# Patient Record
Sex: Male | Born: 1945 | Race: White | Hispanic: No | Marital: Married | Smoking: Former smoker
Health system: Southern US, Community
[De-identification: ages and names within clinical notes are randomized; demographics above are authoritative.]

## PROBLEM LIST (undated history)

## (undated) DIAGNOSIS — I252 Old myocardial infarction: Secondary | ICD-10-CM

## (undated) DIAGNOSIS — E669 Obesity, unspecified: Secondary | ICD-10-CM

## (undated) DIAGNOSIS — I2510 Atherosclerotic heart disease of native coronary artery without angina pectoris: Secondary | ICD-10-CM

## (undated) DIAGNOSIS — I10 Essential (primary) hypertension: Secondary | ICD-10-CM

## (undated) DIAGNOSIS — E119 Type 2 diabetes mellitus without complications: Secondary | ICD-10-CM

## (undated) DIAGNOSIS — G4733 Obstructive sleep apnea (adult) (pediatric): Secondary | ICD-10-CM

## (undated) DIAGNOSIS — E7800 Pure hypercholesterolemia, unspecified: Secondary | ICD-10-CM

## (undated) HISTORY — DX: Obesity, unspecified: E66.9

## (undated) HISTORY — DX: Obstructive sleep apnea (adult) (pediatric): G47.33

## (undated) HISTORY — DX: Old myocardial infarction: I25.2

## (undated) HISTORY — DX: Essential (primary) hypertension: I10

## (undated) HISTORY — DX: Type 2 diabetes mellitus without complications: E11.9

## (undated) HISTORY — PX: KNEE SURGERY: SHX244

## (undated) HISTORY — DX: Atherosclerotic heart disease of native coronary artery without angina pectoris: I25.10

## (undated) HISTORY — DX: Pure hypercholesterolemia, unspecified: E78.00

---

## 1998-01-11 ENCOUNTER — Encounter (HOSPITAL_COMMUNITY): Admission: RE | Admit: 1998-01-11 | Discharge: 1998-04-11 | Payer: Self-pay | Admitting: Cardiology

## 1998-04-12 ENCOUNTER — Encounter (HOSPITAL_COMMUNITY): Admission: RE | Admit: 1998-04-12 | Discharge: 1998-07-11 | Payer: Self-pay | Admitting: Cardiology

## 1998-07-12 ENCOUNTER — Encounter (HOSPITAL_COMMUNITY): Admission: RE | Admit: 1998-07-12 | Discharge: 1998-10-10 | Payer: Self-pay | Admitting: Cardiology

## 1998-10-11 ENCOUNTER — Encounter (HOSPITAL_COMMUNITY): Admission: RE | Admit: 1998-10-11 | Discharge: 1999-01-09 | Payer: Self-pay | Admitting: Cardiology

## 1998-11-14 ENCOUNTER — Other Ambulatory Visit: Admission: RE | Admit: 1998-11-14 | Discharge: 1998-11-14 | Payer: Self-pay | Admitting: Urology

## 1999-01-10 ENCOUNTER — Encounter (HOSPITAL_COMMUNITY): Admission: RE | Admit: 1999-01-10 | Discharge: 1999-04-10 | Payer: Self-pay | Admitting: Cardiology

## 1999-04-11 ENCOUNTER — Encounter (HOSPITAL_COMMUNITY): Admission: RE | Admit: 1999-04-11 | Discharge: 1999-07-10 | Payer: Self-pay | Admitting: Cardiology

## 2000-07-06 ENCOUNTER — Encounter (INDEPENDENT_AMBULATORY_CARE_PROVIDER_SITE_OTHER): Payer: Self-pay | Admitting: Specialist

## 2000-07-06 ENCOUNTER — Inpatient Hospital Stay (HOSPITAL_COMMUNITY): Admission: RE | Admit: 2000-07-06 | Discharge: 2000-07-08 | Payer: Self-pay | Admitting: Urology

## 2002-04-12 ENCOUNTER — Encounter: Admission: RE | Admit: 2002-04-12 | Discharge: 2002-07-11 | Payer: Self-pay | Admitting: Internal Medicine

## 2002-09-13 ENCOUNTER — Encounter: Admission: RE | Admit: 2002-09-13 | Discharge: 2002-12-12 | Payer: Self-pay | Admitting: Internal Medicine

## 2005-06-26 ENCOUNTER — Ambulatory Visit (HOSPITAL_COMMUNITY): Admission: RE | Admit: 2005-06-26 | Discharge: 2005-06-26 | Payer: Self-pay | Admitting: Orthopedic Surgery

## 2007-12-23 ENCOUNTER — Ambulatory Visit: Payer: Self-pay | Admitting: Vascular Surgery

## 2009-01-10 DIAGNOSIS — I2510 Atherosclerotic heart disease of native coronary artery without angina pectoris: Secondary | ICD-10-CM | POA: Insufficient documentation

## 2009-01-10 HISTORY — DX: Atherosclerotic heart disease of native coronary artery without angina pectoris: I25.10

## 2009-01-17 ENCOUNTER — Inpatient Hospital Stay (HOSPITAL_BASED_OUTPATIENT_CLINIC_OR_DEPARTMENT_OTHER): Admission: RE | Admit: 2009-01-17 | Discharge: 2009-01-17 | Payer: Self-pay | Admitting: Cardiology

## 2009-01-17 HISTORY — PX: CARDIAC CATHETERIZATION: SHX172

## 2009-01-18 ENCOUNTER — Ambulatory Visit (HOSPITAL_COMMUNITY): Admission: RE | Admit: 2009-01-18 | Discharge: 2009-01-19 | Payer: Self-pay | Admitting: Cardiology

## 2009-01-24 ENCOUNTER — Encounter (HOSPITAL_COMMUNITY): Admission: RE | Admit: 2009-01-24 | Discharge: 2009-04-24 | Payer: Self-pay | Admitting: Cardiology

## 2009-04-25 ENCOUNTER — Encounter (HOSPITAL_COMMUNITY): Admission: RE | Admit: 2009-04-25 | Discharge: 2009-07-11 | Payer: Self-pay | Admitting: Cardiology

## 2010-06-03 ENCOUNTER — Ambulatory Visit: Payer: Self-pay | Admitting: Cardiology

## 2010-12-12 ENCOUNTER — Ambulatory Visit: Payer: Self-pay | Admitting: Cardiology

## 2011-01-15 ENCOUNTER — Ambulatory Visit: Payer: Self-pay | Admitting: Cardiology

## 2011-01-19 LAB — GLUCOSE, CAPILLARY
Glucose-Capillary: 98 mg/dL (ref 70–99)
Glucose-Capillary: 137 mg/dL — ABNORMAL HIGH (ref 70–99)

## 2011-01-20 LAB — GLUCOSE, CAPILLARY: Glucose-Capillary: 108 mg/dL — ABNORMAL HIGH (ref 70–99)

## 2011-01-21 LAB — CBC
HCT: 43.1 % (ref 39.0–52.0)
MCV: 88.7 fL (ref 78.0–100.0)
Platelets: 124 10*3/uL — ABNORMAL LOW (ref 150–400)
RBC: 4.86 MIL/uL (ref 4.22–5.81)

## 2011-01-21 LAB — BASIC METABOLIC PANEL
BUN: 15 mg/dL (ref 6–23)
Chloride: 103 mEq/L (ref 96–112)
Creatinine, Ser: 1.12 mg/dL (ref 0.4–1.5)
GFR calc Af Amer: >60 mL/min (ref >60)
GFR calc non Af Amer: >60 mL/min (ref >60)
Potassium: 3.8 mEq/L (ref 3.5–5.1)
Sodium: 137 mEq/L (ref 135–145)

## 2011-01-21 LAB — GLUCOSE, CAPILLARY: Glucose-Capillary: 99 mg/dL (ref 70–99)

## 2011-02-04 ENCOUNTER — Encounter: Payer: Self-pay | Admitting: *Deleted

## 2011-02-04 DIAGNOSIS — E119 Type 2 diabetes mellitus without complications: Secondary | ICD-10-CM | POA: Insufficient documentation

## 2011-02-04 DIAGNOSIS — I252 Old myocardial infarction: Secondary | ICD-10-CM | POA: Insufficient documentation

## 2011-02-04 DIAGNOSIS — I2510 Atherosclerotic heart disease of native coronary artery without angina pectoris: Secondary | ICD-10-CM

## 2011-02-04 DIAGNOSIS — E7800 Pure hypercholesterolemia, unspecified: Secondary | ICD-10-CM | POA: Insufficient documentation

## 2011-02-04 DIAGNOSIS — E669 Obesity, unspecified: Secondary | ICD-10-CM | POA: Insufficient documentation

## 2011-02-04 DIAGNOSIS — I10 Essential (primary) hypertension: Secondary | ICD-10-CM | POA: Insufficient documentation

## 2011-02-05 ENCOUNTER — Ambulatory Visit: Payer: Medicare Other | Admitting: Cardiology

## 2011-02-10 ENCOUNTER — Encounter: Payer: Self-pay | Admitting: Cardiology

## 2011-02-10 ENCOUNTER — Ambulatory Visit (INDEPENDENT_AMBULATORY_CARE_PROVIDER_SITE_OTHER): Payer: Medicare Other | Admitting: Cardiology

## 2011-02-10 VITALS — BP 130/88 | HR 60 | Ht 69.0 in | Wt 231.2 lb

## 2011-02-10 DIAGNOSIS — E7800 Pure hypercholesterolemia, unspecified: Secondary | ICD-10-CM

## 2011-02-10 DIAGNOSIS — E669 Obesity, unspecified: Secondary | ICD-10-CM

## 2011-02-10 DIAGNOSIS — I2510 Atherosclerotic heart disease of native coronary artery without angina pectoris: Secondary | ICD-10-CM

## 2011-02-10 DIAGNOSIS — I10 Essential (primary) hypertension: Secondary | ICD-10-CM

## 2011-02-10 NOTE — Assessment & Plan Note (Signed)
Currently on simvastatin 10 mg daily. His blood work is being followed by Dr. Jacky Kindle.

## 2011-02-10 NOTE — Progress Notes (Signed)
Jon Huang Date of Birth: [birth year]   History of Present Illness: Jon Huang is seen for followup today. He states he has been doing very well. He has been going to the gym regularly and walking at least 3 miles per day. He has lost 18 pounds. He continues to work part-time at [employer]. He denies any chest pain, shortness of breath, or palpitations.  Current Outpatient Prescriptions on File Prior to Visit  Medication Sig Dispense Refill  . amLODipine (NORVASC) 10 MG tablet Take 1 tablet by mouth Daily.      Marland Kitchen aspirin 325 MG tablet Take 325 mg by mouth daily.        . benazepril (LOTENSIN) 40 MG tablet Take 1 tablet by mouth Daily.      . clopidogrel (PLAVIX) 75 MG tablet Take 75 mg by mouth daily.        . hydrochlorothiazide 25 MG tablet Take 25 mg by mouth daily.        Marland Kitchen labetalol (NORMODYNE) 200 MG tablet Take 200 mg by mouth 2 (two) times daily.        Marland Kitchen NITROSTAT 0.4 MG SL tablet Place 1 tablet under the tongue as directed.      . simvastatin (ZOCOR) 10 MG tablet Take 10 mg by mouth at bedtime.        Jon Huang 1 % cream as directed.      . triamcinolone (KENALOG) 0.1 % cream as directed.      . valsartan (DIOVAN) 160 MG tablet Take 80 mg by mouth. 1/2 tablet daily       . DISCONTD: amLODipine-benazepril (LOTREL) 10-40 MG per capsule Take 1 capsule by mouth daily.          No Known Allergies  Past Medical History  Diagnosis Date  . Hypertension   . History of heart attack     posterior  . Hypercholesterolemia   . Diabetes mellitus, type 2   . Obesity   . Coronary artery disease April 2010    post stenting of the LAD  / chronic total occlusion of the left circumflex coronary and a high-grade stenosis in the second abtuse marginal vessel that is not amenable to angioplasty    Past Surgical History  Procedure Date  . Cardiac catheterization 01/17/2009    with stenting of the LAD  / chronic total occlusion of the left circumflex coronary and a  high-grade stenosis in the second abtuse marginal vessel that is not amenable to angioplasty  . Knee surgery     History  Smoking status  . Former Smoker  . Types: Cigarettes  . Quit date: 02/03/1986  Smokeless tobacco  . Not on file    History  Alcohol Use No    Family History  Problem Relation Age of Onset  . Stroke      strong family history of   . Heart disease      strong family history of  . Hypertension      strong family history of   . Hypertension Mother   . Heart attack Father     Review of Systems:   All other systems were reviewed and are negative.  Physical Exam: BP 130/88  Pulse 60  Ht 5\' 9"  (1.753 m)  Wt 231 lb 4 oz (104.894 kg)  BMI 34.15 kg/m2 He is a pleasant obese white male in no distress. HEENT exam is unremarkable. He has no JVD or bruits. Lungs are clear. Cardiac  exam reveals a regular rate and rhythm without gallop, murmur, or click. Abdomen is obese, soft, nontender. He has no significant edema. He does have a lot of superficial varicosities in his right lower extremity with some hyperpigmentation. Pedal pulses are good. Neurologic exam is nonfocal. LABORATORY DATA: ECG demonstrates sinus bradycardia with a rate of 57 beats per minute. It is otherwise a normal ECG.  Assessment / Plan:

## 2011-02-10 NOTE — Patient Instructions (Signed)
I am very happy with your weight loss. Keep up the good work.  Stay on the same medications.  Follow up with Dr. Jacky Kindle for your lab work.  We will schedule you for a stress nuclear study to follow up on your coronary artery disease.  I will plan on follow up in 6 months.

## 2011-02-10 NOTE — Assessment & Plan Note (Signed)
Blood pressure is well controlled on multiple medications. We will continue same.

## 2011-02-10 NOTE — Assessment & Plan Note (Signed)
He is currently pain-free. He is doing very well with his exercise program. He has been 3 years since his last stress evaluation so we will schedule him for a stress Myoview study.

## 2011-02-10 NOTE — Assessment & Plan Note (Signed)
I am very encouraged with his weight loss. I've recommended he continue with his exercise and diet program.

## 2011-02-17 ENCOUNTER — Telehealth: Payer: Self-pay | Admitting: Cardiology

## 2011-02-17 NOTE — Telephone Encounter (Signed)
PATSY  FAX [phone] NEEDS NUKE AND ECHO °

## 2011-02-18 ENCOUNTER — Telehealth: Payer: Self-pay | Admitting: *Deleted

## 2011-02-18 ENCOUNTER — Ambulatory Visit (HOSPITAL_COMMUNITY): Payer: Medicare Other | Attending: Cardiology | Admitting: Radiology

## 2011-02-18 VITALS — Ht 69.0 in | Wt 231.0 lb

## 2011-02-18 DIAGNOSIS — I2510 Atherosclerotic heart disease of native coronary artery without angina pectoris: Secondary | ICD-10-CM | POA: Insufficient documentation

## 2011-02-18 MED ORDER — TECHNETIUM TC 99M TETROFOSMIN IV KIT
33.0000 | PACK | Freq: Once | INTRAVENOUS | Status: AC | PRN
  Administered 2011-02-18: 33 via INTRAVENOUS

## 2011-02-18 MED ORDER — TECHNETIUM TC 99M TETROFOSMIN IV KIT
10.6000 | PACK | Freq: Once | INTRAVENOUS | Status: AC | PRN
  Administered 2011-02-18: 11 via INTRAVENOUS

## 2011-02-18 MED ORDER — REGADENOSON 0.4 MG/5ML IV SOLN
0.4000 mg | Freq: Once | INTRAVENOUS | Status: AC
  Administered 2011-02-18: 0.4 mg via INTRAVENOUS

## 2011-02-18 NOTE — Telephone Encounter (Signed)
L/M for pt to call back regarding Labetalol 200mg  tab.  He is taking 2 tablets in the AM; Per Dr. Swaziland:  This should be 1 tab BID;  L/M for pt to call back for instructions.

## 2011-02-18 NOTE — Progress Notes (Signed)
[facility] 3 NUCLEAR MED [address] [place] [address] [phone]  Cardiology Nuclear Med Study  Jon Huang is a 65 y.o. male [id number] Mar 08, 1946   Nuclear Med Background Indication for Stress Test:  Evaluation for Ischemia and PTCA/Stent Patency History:  '97 PWMI; '09 LKG:MWNUU infero-lateral defect, no ischemia, EF=46%,  '10 PTCA/Stent-LAD, '10 Stess Echo:abnormal wall motion. Cardiac Risk Factors: Carotid Disease, Family History - CAD, History of Smoking, Hypertension, Lipids, NIDDM and Obesity  Symptoms:  No cardiac symptoms.   Nuclear Pre-Procedure Caffeine/Decaff Intake:  None NPO After: 8:00pm   Lungs:  Clear.  O2 sat 96%. IV 0.9% NS with Angio Cath:  20g  IV Site: R Hand  IV Started by:  Stanton Kidney, EMT-P  Chest Size (in):  46 Cup Size: n/a  Height: 5\' 9"  (1.753 m)  Weight:  231 lb (104.781 kg)  BMI:  Body mass index is 34.11 kg/(m^2). Tech Comments:  TOOK all meds this morning, per patient.  Labetalol 200mg  two a day, per patient.    Nuclear Med Study 1 or 2 day study: 1 day  Stress Test Type:  Treadmill/Lexiscan  Reading MD: Marca Ancona, MD  Order Authorizing Provider:  Peter Swaziland, MD  Resting Radionuclide: Technetium 63m Tetrofosmin  Resting Radionuclide Dose: 10.6 mCi   Stress Radionuclide:  Technetium 74m Tetrofosmin  Stress Radionuclide Dose: 33.0 mCi           Stress Protocol Rest HR: 59 Stress HR: 110  Rest BP: 105/73 Stress BP: 182/92  Exercise Time (min): 12:29 METS: 11.2   Predicted Max HR: 155 bpm % Max HR: 70.97 bpm Rate Pressure Product: 72536   Dose of Adenosine (mg):  n/a Dose of Lexiscan: 0.4 mg  Dose of Atropine (mg): n/a Dose of Dobutamine: n/a mcg/kg/min (at max HR)  Stress Test Technologist: Smiley Houseman, CMA-N  Nuclear Technologist:  Domenic Polite, CNMT     Rest Procedure:  Myocardial perfusion imaging was performed at rest 45 minutes following the intravenous administration of  Technetium 62m Tetrofosmin. Rest ECG: Nonspecific T-wave changes and isolated PVC.  Stress Procedure:  The patient attempted to walk the treadmill utilizing the Bruce protocol for 10:15, but was unable to reach his target heart rate due to the being on his Labetalol.  He then received IV Lexiscan 0.4 mg over 15-seconds with concurrent low level exercise and then Technetium 72m Tetrofosmin was injected at 30-seconds while the patient continued walking one more minute.  There were no diagnostic changes with Lexiscan, other than occasional PVC's.  Quantitative spect images were obtained after a 45-minute delay. Stress ECG: Insignificant upsloping ST depression.   QPS Raw Data Images:  Normal; no motion artifact; normal heart/lung ratio. Stress Images:  Basal to mid inferolateral perfusion defect.  Rest Images:  Basal to mid inferolateral perfusion defect.  Subtraction (SDS):  Primarily fixed basal to mid inferolateral perfusion defect.  Transient Ischemic Dilatation (Normal <1.22):  1.10 Lung/Heart Ratio (Normal <0.45):  0.37  Quantitative Gated Spect Images QGS EDV:  139 ml QGS ESV:  70 ml QGS cine images:  Inferolateral hypokinesis.  QGS EF: 49%  Impression Exercise Capacity:  Excellent exercise capacity, switched to Lexiscan when unable to attain target heart rate.  BP Response:  Hypertensive blood pressure response. Clinical Symptoms:  No chest pain, switched to Lexiscan because unable to attain target heart rate.  He did exercise for 12:29.  ECG Impression:  Insignificant upsloping ST segment depression. Comparison with Prior Nuclear Study:  No images to compare  Overall Impression:  Primarily fixed basal to mid inferolateral perfusion defect with inferolateral wall motion abnormality. Suspect infarction with minimal ischemia.   Jon Huang [employer]

## 2011-02-19 NOTE — Progress Notes (Signed)
Copy routed to Dr. Swaziland.Mirna Mires

## 2011-02-19 NOTE — Progress Notes (Signed)
Nuclear study shows Inferolateral infarct consistent with known blockage. Otherwise no ischemia. Mildly reduced EF. Continue medical RX. Please report. Theron Arista [place]iland

## 2011-02-20 NOTE — Progress Notes (Signed)
Notified of nuclear stress test results. Also advised that the nuclear nurses stated he was taking his Labetalol 200 mg one daily. Per Dr. Swaziland should be taking 1 tablet twice a day. States he has taken the med like that for 10 yrs and doesn't want to change. Will send copy of nuc test to Dr, Jacky Kindle

## 2011-02-24 NOTE — H&P (Signed)
NAMESOFIA, JAQUITH NO.:  [id number]   MEDICAL RECORD NO.:  [id number]           PATIENT TYPE:   LOCATION:                                 FACILITY:   PHYSICIAN:  Peter M. Swaziland, M.D.       DATE OF BIRTH:   DATE OF ADMISSION:  DATE OF DISCHARGE:                              HISTORY & PHYSICAL   HISTORY OF PRESENT ILLNESS:  Mr. Goree is a 65 year old white male  with known history of coronary artery disease.  He has a history of  remote posterior myocardial infarction in 1997.  Cardiac catheterization  at that time demonstrated occlusion of the left circumflex coronary  artery.  He had no other significant obstructive disease.  Left  ventricular function at that time was low normal with ejection fraction  of 50-55% with posterolateral hypokinesia and mid-inferior akinesia.  He  has been treated medically since that time.  He has had infrequent  follow up.  Recently, he was seen for evaluation.  He has gained a  significant amount of weight of over 17 pounds over the past several  years.  He has had some intermittent back pain.  He really denied any  significant chest pain, shortness of breath, palpitations, or dizziness.  To further evaluate his coronary risk, he underwent a stress Cardiolite  study on December 27, 2007.  His resting ECG was unremarkable.  The patient  was seen for evaluation recently.  He underwent a stress echo evaluation  on January 03, 2009.  He was able to exercise for 7 minutes and 25 seconds  on the Bruce protocol, which represented a significant decrease compared  to prior stress test in 2009.  He did have increased ST-segment  depression in the inferior lateral leads.  He had no significant chest  pain, but did develop dyspnea.  His echocardiographic images  demonstrated akinesia of the posterior wall and he also developed  significant lateral wall hypokinesia.  Given these change in his  exercise tolerance and his echo findings, it is  recommended that he  undergo cardiac catheterization at this time.   PAST MEDICAL HISTORY:  1. He has history of obesity.  2. He has history of remote posterior myocardial infarction.  3. History of hypertension.  4. History of hypercholesterolemia.  5. He has had prior knee surgery.  6. He has a history of diabetes mellitus type 2 and is being treated      with dietary measures.   ALLERGIES:  He has no known allergies.   CURRENT MEDICATIONS:  1. Aspirin 325 mg daily.  2. Labetalol 200 mg b.i.d.  3. Lotrel 5/20 mg b.i.d.  4. Zocor 10 mg daily.  5. Hydrochlorothiazide 25 mg per day.  6. Diovan 160 mg per day.   SOCIAL HISTORY:  The patient is currently working 2 jobs.  He is still  in his business as [employer].  He is married and has 2  children.  He reports, he quit smoking approximately 25 years ago.  He  does drink occasional alcohol.   FAMILY HISTORY:  Father  had a CVA at age 63 and subsequent died of  myocardial infarction at age 29.  Multiple family members have had  hypertension.   REVIEW OF SYSTEMS:  The patient has had weight gain.  He denies any  lower extremity edema, orthopnea, or PND.  He has had no claudication  symptoms.  He has had some back pain.  He denies any bleeding disorder.  No recent history of TIA or stroke.  Appetite, and bowel and bladder  habits have been normal.  All other systems were reviewed and are  negative.   PHYSICAL EXAMINATION:  GENERAL:  The patient is obese white male, in no  distress.  VITAL SIGNS:  His weight is 250.4, blood pressure is 138/88, pulse 62  and regular, respirations are normal.  HEENT:  Normocephalic and atraumatic.  Pupils are equal, round, and  reactive.  Sclerae clear.  Oropharynx is clear.  NECK:  Without JVD, adenopathy, thyromegaly, or bruits.  LUNGS:  Clear.  CARDIAC:  Regular rate and rhythm without gallop, murmur, rub, or click.  ABDOMEN:  Morbidly obese, soft, and nontender.  He has no   hepatosplenomegaly, mass, or bruits.  EXTREMITIES:  Without edema.  Pedal pulses are 2+ and symmetric.  NEUROLOGIC:  He is alert and oriented x4.  Cranial nerves II through XII  are intact.  SKIN:  Warm and dry.   LABORATORY DATA:  His ECG at rest was normal.  His glucose was 114, BUN  16, and creatinine 1.1.  Electrolytes were normal.  CBC was normal.  Coags were normal.   IMPRESSION:  1. Coronary artery disease with known left circumflex coronary      occlusion.  Recent stress echo demonstrates decreased-exercise      tolerance, increase ST changes, and wall motion abnormality.  2. Morbid obesity.  3. Hypertension.  4. Hypercholesterolemia.  5. Diabetes mellitus type 2, diet controlled.   PLAN:  We will proceed with diagnostic cardiac catheterization with  further therapy pending these results.           ______________________________  Peter M. Swaziland, M.D.     PMJ/MEDQ  D:  01/16/2009  T:  01/16/2009  Job:  [id number]   cc:   Geoffry Paradise, M.D.

## 2011-02-24 NOTE — Cardiovascular Report (Signed)
NAMESTANELY, SEXSON NO.:  [id number]   MEDICAL RECORD NO.:  [id number]          PATIENT TYPE:  OIB   LOCATION:  2508                         FACILITY:  [facility]   PHYSICIAN:  Peter M. Swaziland, M.D.  DATE OF BIRTH:  [birth year]   DATE OF PROCEDURE:  DATE OF DISCHARGE:                            CARDIAC CATHETERIZATION   INDICATIONS FOR PROCEDURE:  A 65 year old white male with known history  of coronary disease status post posterior myocardial infarction in 1997  with occlusion of the left circumflex coronary artery.  He presented  recently with an abnormal stress echo test.  Diagnostic cardiac  catheterization demonstrated a high-grade proximal LAD stenosis of 95%.  There was 90-95% stenosis in the mid circumflex followed by total  occlusion following the third marginal branch.  These lesions were felt  to be suitable for catheter-based intervention and he was admitted for  that procedure.   PROCEDURES:  Intracoronary stenting of the proximal LAD and balloon  angioplasty of the mid left circumflex coronary artery.   ACCESS:  Via the right femoral artery using the standard Seldinger  technique.   EQUIPMENT USED:  A 6-French arterial sheath, 6-French Voda left 4 guide,  Prowater wire, high-torque floppy wire, [employer] 3 wire.  For the  LAD, we used a 2.5- x 12-mm apex balloon, a 3.5- x 15-mm XIENCE stent,  and a 3.75- x 12-mm Voyager [place] balloon.  For the left circumflex  coronary artery, we used a 1.5- x 1-mm Sprinter Legend balloon.   MEDICATIONS:  The patient received 2 mg of IV Versed, nitroglycerin 200  mcg intracoronary x1, Angiomax bolus of 0.75 mg/kg followed by  continuous infusion of 1.75 mg/kg per hour.   ACT was 337 seconds.  The patient was hemodynamically stable throughout  the procedure.  Contrast was 410 mL of Omnipaque.   The first intervention was the proximal LAD.  This was a 95% stenosis  after the takeoff of a high diagonal branch.  It  was focal.  It was a  large vessel.  We were able to cross this lesion with a Prowater wire  with moderate difficulty due to tendency to select very small side  branches and the diagonal branch.  However, once we were able to cross  the LAD, procedure went very smoothly.  The LAD lesion was predilated  with the 2.5- x 12-mm apex balloon up to 9 atmospheres x2.  We then  deployed a 3.5- x 15-mm XIENCE drug-eluting stent at 9 atmospheres and  then 12 atmospheres with the stent balloon.  The stent was then  postdilated using a 3.75- x 12-mm [place] Voyager balloon up to 14  atmospheres x2.  This yielded an excellent angiographic result with 0%  residual stenosis and TIMI grade 3.   We next addressed the mid left circumflex coronary stenosis.  Again, the  lesion extended from the second to the origin of the third obtuse  marginal vessel.  Following third obtuse marginal vessel, the left  circumflex was occluded and supplied by right-to-left collaterals.  This  lesion was calcified.  There was also  some involvement of the origin of  the second marginal up to about 60-70%.  We initially planned to wire  both the second and third marginal vessel, predilate with balloons, and  then potentially stent the mid circumflex lesion.  We used the same left  Voda 4 guide.  We used a Research scientist (physical sciences) as well as a Museum/gallery conservator and later a [employer] wire.  This lesion proved to be very  difficult.  We were able to pass wires down both the second and third  obtuse marginal vessel, but it was very difficult to access the third  marginal vessel.  We were unable to pass a 2.5-mm apex balloon down the  circumflex.  We tried a 2.0 apex balloon with inability to cross past  the second obtuse marginal vessel.  We were finally able to cross with a  1.5-mm x 12 mm Sprinter Legend balloon.  We dilated 3 times across the  segment up to 12 atmospheres which was the rated balloon versus  pressure.  However,  despite this dilatation with very small balloon, we  were unable to pass a larger balloon including a 2.0 apex balloon and a  2.0- x 12-mm Sprinter Legend balloon.  We attempted using a buddy wire,  but we are still unable to pass the balloon.  We withdrew the wire down  the second marginal vessel to see if this would help and it gave [place] no  further benefit.  We had excellent guide support with Voda guide.  We  exchanged the Prowater wire for a Mir[employer] 3 wire to increase our  wire support, and despite excellent wire support, we were only able to  cross with a 1.5-mm Sprinter Legend.  We again performed 2 inflations at  14 atmospheres using this balloon, but despite this we were unable to  pass any larger balloon.  At this point, the procedure was ceased.  There was no change in the angiographic appearance.  The patient was  pain free and hemodynamically stable.   FINAL INTERPRETATION:  1. Successful intracoronary stenting of the proximal left anterior      descending.  2. Unsuccessful balloon angioplasty of the mid left circumflex      coronary due to inability to pass an adequate balloon across the      lesion.   PLAN:  At this point, we will continue aspirin and Plavix.  Continue  aggressive risk factor modification.  If the patient were to be  significantly symptomatic from the marginal lesions which I think it is  unlikely that consideration would be to try a rotational atherectomy.           ______________________________  Peter M. Swaziland, M.D.     PMJ/MEDQ  D:  01/18/2009  T:  01/19/2009  Job:  [id number]   cc:   Geoffry Paradise, M.D.

## 2011-02-24 NOTE — Cardiovascular Report (Signed)
NAMEMICIAH, COVELLI NO.:  [id number]   MEDICAL RECORD NO.:  [id number]          PATIENT TYPE:  OIB   LOCATION:  1967                         FACILITY:  [facility]   PHYSICIAN:  Peter M. Swaziland, M.D.  DATE OF BIRTH:  25-Feb-1946   DATE OF PROCEDURE:  01/17/2009  DATE OF DISCHARGE:  01/17/2009                            CARDIAC CATHETERIZATION   HISTORY OF PRESENT ILLNESS:  Jon Huang is a 65 year old white male  with known history of coronary artery disease.  He is status post  posterolateral myocardial infarction in 1997 with documented occlusion  of the distal left circumflex coronary artery.  He presents more  recently with some back pain and has gained a significant amount of  weight.  He underwent a stress echo which showed a decline in exercise  tolerance associated with increased ST-segment depression and septal  hypokinesia in addition to inferolateral hypokinesia.  Based on these  findings, cardiac catheterization was recommended.   PROCEDURE:  Left heart catheterization, coronary and left ventricular  angiography.   EQUIPMENT:  A 4-French 4 cm left Judkins catheter, 4-French 3-DRC  catheter, 4-French pigtail catheter, and 4-French arterial sheath.   MEDICATIONS:  Local anesthesia 1% Xylocaine.   CONTRAST:  100 mL of Omnipaque.   HEMODYNAMIC DATA:  Aortic pressure was 127/75 with a mean of 96 mmHg,  left ventricular pressure is 138 with EDP of 20 mmHg.   ANGIOGRAPHIC DATA:  The left coronary artery arises and distributes  normally.  There is mild atherosclerosis in the left main coronary  artery less than 20%.   The left anterior descending artery is a very large vessel extending out  to the apex.  It has a 30% proximal lesion.  Following the takeoff of a  high diagonal branch, there is a 90-95% eccentric proximal LAD stenosis.  There is TIMI grade 2 flow to the distal LAD.  The first diagonal branch  has a high takeoff and is a moderate-to-large  branch with only minor  irregularities proximally less than 20%.   The left circumflex coronary artery is a codominant vessel.  It gives  rise to a very small first marginal branch, a small second marginal  branch then a larger third marginal branch.  The left circumflex has a  90% stenosis following the second marginal branch up to the third  marginal branch.  The circumflex then occluded after the third marginal  branch.  There are right-to-left collaterals to the distal circumflex  and posterolateral branches.  There is a 60% ostial stenosis in the  second obtuse marginal vessel.  The right coronary artery arises and  distributes normally, it basically gives rise to the PDA.  Has minor  irregularities less than 20%.   Left ventricular angiography was performed in the RAO view.  This  demonstrates normal left ventricular size.  There is mild inferior wall  hypokinesia with overall ejection fraction of 55%.  There is no  significant mitral insufficiency.   FINAL INTERPRETATION:  1. Severe two-vessel obstructive atherosclerotic coronary artery      disease.  2. Good left ventricular systolic function.  PLAN:  We will discuss revascularization options with the patient  including potential PCI of the LAD and mid circumflex versus possible  coronary artery bypass surgery.           ______________________________  Peter M. Swaziland, M.D.     PMJ/MEDQ  D:  01/17/2009  T:  01/18/2009  Job:  [id number]   cc:   Geoffry Paradise, M.D.

## 2011-02-24 NOTE — Procedures (Signed)
CAROTID DUPLEX EXAM   INDICATION:  Syncope.  The patient had one episode of syncope  approximately 2 weeks ago.  The patient states he has experienced the  same sensation episodically since he was 64 years old.  The patient  complains of bilateral hand numbness with position.   HISTORY:  Diabetes:  No.  Cardiac:  MI in 2002.  Hypertension:  Yes.  Smoking:  No.  Previous Surgery:  No.  CV History:  Amaurosis Fugax No, Paresthesias No, Hemiparesis No                                       RIGHT             LEFT  Brachial systolic pressure:         138               140  Brachial Doppler waveforms:         WNL               WNL  Vertebral direction of flow:        Not visualized    Antegrade  DUPLEX VELOCITIES (cm/sec)  CCA peak systolic                   75                69  ECA peak systolic                   102               76  ICA peak systolic                   57                67  ICA end diastolic                   22                22  PLAQUE MORPHOLOGY:                  Intimal thickening                  Homogenous  PLAQUE AMOUNT:                      N/A               Minimal  PLAQUE LOCATION:                    N/A               Bifurcation, ICA   IMPRESSION:  1. 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  2. Patent ECAs bilaterally.  3. 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   ___________________________________________  Quita Skye Hart [place], M.D.   PB/MEDQ  D:  12/23/2007  T:  12/23/2007  Job:  [id number]

## 2011-02-27 NOTE — Op Note (Signed)
[name], Jon Huang              ACCOUNT NO.:  [id number]   MEDICAL RECORD NO.:  [id number]          PATIENT TYPE:  AMB   LOCATION:  DAY                          FACILITY:  [facility]   PHYSICIAN:  Marlowe Kays, M.D.  DATE OF BIRTH:  01/30/1946   DATE OF PROCEDURE:  06/26/2005  DATE OF DISCHARGE:                                 OPERATIVE REPORT   PREOPERATIVE DIAGNOSIS:  Posterior horn tear, medial meniscus, left knee.   POSTOPERATIVE DIAGNOSES:  1.  Posterior horn tear of medial meniscus.  2.  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   OPERATION:  Left knee arthroscopy with partial medial meniscectomy and  debridement of medial femoral condyle.   SURGEON:  Marlowe Kays, M.D.   ASSISTANT:  Nurse.   ANESTHESIA:  General ______________   INDICATIONS FOR PROCEDURE:  He injured his left knee playing tennis on  September 6 with a MRI demonstrating the extensive tear. He also had fairly  diffuse grade 2/4 chondromalacia of the medial femoral condyle. The  remainder of his knee looked unremarkable.   PROCEDURE:  Satisfactory general anesthesia, pneumatic tourniquet. Leg was  Esmarch up nonsterilely, thigh stabilizer. Right leg was prepped with  DuraPrep from stabilizer to ankle and draped in a sterile field. Ace wrap  and knee protector to the right leg. Superior medial saline inflow, first  through an anterolateral portal, the medial joint was evaluated. He had a  good bit of synovitis in the anterior compartment of the knee which I  resected with 3.5 shaver. The chondromalacia of the medial femoral condyle,  I debrided down to smooth surface. The posterior horn tear was quite  extensive, and starting with small flap baskets, I resected most of the  meniscus back to stable rim and followed this with 3.5 shaver which I  smoothed, smoothing down the meniscus until smooth and stable on probing. He  did have some element of bleeding from the synovium posteriorly,  presumably  due to irritation from entrapment of the meniscus tear; this was not  iatrogenic. Then looking at median gutter and suprapatellar area, had some  wear of the patella but nothing that required shaving. I reversed portals.  His lateral compartment looked normal. The knee joint was then irrigated  until clear. There was no bleeding present at this time. The 2-inch portals  were closed with 4-0 nylon. I injected 20 cc of 1/2% Marcaine with adrenalin  and 4 mg of morphine through the inflow apparatus  which was then removed and this portal closed with 4-0 nylon as well.  Betadine, Adaptic and dry dressing were applied. Tourniquet was released. He  tolerated the procedure well and was taken to the recovery room in  satisfactory condition without complications.           ______________________________  Marlowe Kays, M.D.     JA/MEDQ  D:  06/26/2005  T:  06/27/2005  Job:  [id number]

## 2011-02-27 NOTE — H&P (Signed)
[facility]  Patient:    Jon Huang, Jon Huang                     MRN: [id number] Adm. Date:  [id number] Attending:  Londell Moh                         History and Physical  SERVICE:  Urology.  ADMITTING DIAGNOSIS:  Benign prostatic hypertrophy with bladder outlet obstruction.  SECONDARY DIAGNOSES 1. Hypertension. 2. Coronary artery disease.  HISTORY:  This 65 year old male has had longstanding problems with bladder outlet obstruction.  The patient has tried both alpha blockers and Proscar without much improvement.  The patient has requested that a permanent solution be utilized to try to treat his bladder outlet symptoms.  He was given the option of TUNA, ______  versus TURP and elected to undergo TURP, to be admitted following that procedure.  PAST MEDICAL HISTORY:  Remarkable for hypertension and coronary artery disease.  Patient had a cardiac catheterization.  He uses nitroglycerin but he uses that very infrequently.  He is also known to have some problems with chronic joint pain.  CURRENT MEDICATIONS 1. Verapamil 240 mg b.i.d. 2. Normodyne 200 mg daily. 3. ______ b.i.d. 4. Aspirin one daily, which was stopped one week ago. 5. Nitroglycerin sublingual.  PREVIOUS SURGERY:  Knee surgery x 2, which was complicated by postoperative nausea and vomiting.  SOCIAL HISTORY:  His social history is unremarkable.  He does not smoke or use alcohol but did smoke in the past.  He is married.  REVIEW OF SYSTEMS:  His review of systems is otherwise noncontributory.  PHYSICAL EXAMINATION  VITAL SIGNS:  His temperature is 98.2, pulse 68, respirations 20, blood pressure 138/90.  GENERAL:  He is a well-developed, well-nourished male in no acute distress.  HEENT:  Normocephalic, atraumatic.  Cranial nerves II-XII appeared grossly intact.  NECK:  Supple with no adenopathy or thyromegaly.  HEART:  Regular rate and rhythm with no murmurs, thrills,  gallops, rubs or heaves.  LUNGS:  Clear.  ABDOMEN:  Soft and nontender with no palpable masses, rebound or guarding.  GU:  Testicles are of normal size, shape and consistency.  Cord structures are normal.  No hydrocele, spermatocele, varicocele, hernia or adenopathy.  EXTREMITIES:  No cyanosis, clubbing or edema.  RECTAL:  Examination shows a 4+ benign-feeling prostate.  IMPRESSION:  Benign prostatic hypertrophy with bladder outlet obstruction.  PLAN:  Admit following TURP.DD:  07/06/00 TD:  07/07/00 Job: [id number]

## 2011-02-27 NOTE — Op Note (Signed)
[facility]  Patient:    Jon Huang, Jon Huang                     MRN: [id number] Proc. Date: 07/06/00 Adm. Date:  [id number] Attending:  Londell Moh CC:         Richard A. Jacky Kindle, M.D.   Operative Report  PREOPERATIVE DIAGNOSES:   Benign prostatic hypertrophy with bladder outlet obstruction.  POSTOPERATIVE DIAGNOSES:  Benign prostatic hypertrophy with bladder outlet obstruction.  OPERATION:  Cystoscopy and transurethral resection of prostate.  SURGEON:  Jamison Neighbor, M.D.  ANESTHESIA:  General.  COMPLICATIONS:  None.  DRAINS:  None.  INDICATIONS:  [age]-year-old male has had problems with bladder outlet for many years.  Because of the patients cardiac history, we have endeavored as much as possible to treat him in a nonoperative fashion.  The patient was initially treated with Flomax with only minimal improvement.  He was subsequently placed on a combination of Flomax and Proscar and at first he thought that this did control his symptoms.  Over the last year or so as symptoms have increased and his _____ score has gotten much higher.  He is now to the point where is requesting a more permanent solution.  The patient was given the option of continuing on medical management as well as the option of TURP versus TUNA versus TARGIS.  The patient asked to have the most definitive procedure done and is now to undergo a TURP.  He understands the risks and benefits of the procedure.  He is simply advised that he might have postoperative hematuria as well as potential risks for impotence and incontinence or retention.  He gave full and informed consent.  DESCRIPTION OF PROCEDURE:  After the successful induction of general anesthesia, the patient was placed in the dorsal lithotomy position and prepped with Betadine and draped in the usual sterile fashion.  The urethra was calibrated to 30 [place] with [employer] sound.  The Olympus continuous  flow resectoscope sheath was then inserted and using a Timberlake obturator, the Stern-McCarthy resectoscope was inserted with a 12 degree lens in place.  The bladder was carefully inspected.  Trabeculation was noted.  There were not tumors stones.  The ureteral orifices were unremarkable and well back from the intended line of resection.  The patient had lateral lobe as well as median lobe hypertrophy.  The bladder neck was very carefully taken down and the median lobe was resected all the way back to the verumontanum. The right lateral lobe was then resected beginning at the 11 oclock position and extending down to the floor of the prostate.  This dissection was taken down to the surgical capsule and out as far as the verumontanum.  An identical procedure was performed on the left side.  Some additional apical tissue and tissue across the floor resected and some tissue anteriorly was also resected. All chips was irrigated from the bladder.  Hemostasis was obtained with electrocautery.  Careful inspection showed that the ureteral orifices did not ________, the bladder neck could not be undermined, the verumontanum was not injured and an adequate resection had been done.  A rectal exam showed that there was little if any prostatic tissue remaining. the resectoscope was removed.  A Foley catheter was inserted and was placed to continuous bladder irrigation, and this irrigated freely.  The patient tolerated the procedure well and was taken to the recovery room in good condition. DD:  07/06/00 TD:  07/07/00 Job: [id number]

## 2011-06-29 ENCOUNTER — Other Ambulatory Visit: Payer: Self-pay | Admitting: *Deleted

## 2011-06-29 MED ORDER — CLOPIDOGREL BISULFATE 75 MG PO TABS: 75.0000 mg | ORAL_TABLET | Freq: Every day | ORAL | Status: DC

## 2011-06-29 NOTE — Telephone Encounter (Signed)
Refilled generic plavix 

## 2011-08-24 ENCOUNTER — Encounter: Payer: Self-pay | Admitting: Cardiology

## 2011-08-24 ENCOUNTER — Ambulatory Visit (INDEPENDENT_AMBULATORY_CARE_PROVIDER_SITE_OTHER): Payer: Medicare Other | Admitting: Cardiology

## 2011-08-24 DIAGNOSIS — I2510 Atherosclerotic heart disease of native coronary artery without angina pectoris: Secondary | ICD-10-CM

## 2011-08-24 DIAGNOSIS — E7800 Pure hypercholesterolemia, unspecified: Secondary | ICD-10-CM

## 2011-08-24 MED ORDER — CLOPIDOGREL BISULFATE 75 MG PO TABS: 75.0000 mg | ORAL_TABLET | Freq: Every day | ORAL | Status: DC

## 2011-08-24 NOTE — Patient Instructions (Signed)
We will call for your last lab work from Dr. Jacky Kindle.  Continue your current medications.  I will see you again in 6 months.

## 2011-08-26 NOTE — Assessment & Plan Note (Signed)
Lipid status is unknown. He is on simvastatin. Will request a copy of his last lab work. If not current he will need to have this repeated.

## 2011-08-26 NOTE — Assessment & Plan Note (Addendum)
His recent stress test was encouraging. I have encouraged him with his efforts at weight loss. He is to continue with his exercise program. He needs to see Dr. Jacky Kindle to update his lab work. I'll follow up again in 6 months.

## 2011-08-26 NOTE — Progress Notes (Signed)
Tinnie Gens Rayner Date of Birth: 1946/03/03   History of Present Illness: Jon Huang is seen for followup today. He states he has been doing very well. He continues to exercise regularly and is involved in a [employer] program. He also plays golf. He is still working 40 hours a week at [employer]. He denies any chest pain or shortness of breath. His typical blood pressure reading is 130/85. He cannot recall when he had his last lab work done but feels that he is due to see Dr. Jacky Kindle soon about this.  Current Outpatient Prescriptions on File Prior to Visit  Medication Sig Dispense Refill  . amLODipine (NORVASC) 10 MG tablet Take 1 tablet by mouth Daily.      Marland Kitchen aspirin 325 MG tablet Take 325 mg by mouth daily.        . benazepril (LOTENSIN) 40 MG tablet Take 1 tablet by mouth Daily.      . clopidogrel (PLAVIX) 75 MG tablet Take 1 tablet (75 mg total) by mouth daily.  30 tablet  11  . hydrochlorothiazide 25 MG tablet Take 25 mg by mouth daily.        Marland Kitchen labetalol (NORMODYNE) 200 MG tablet Take 200 mg by mouth 2 (two) times daily.        Marland Kitchen NITROSTAT 0.4 MG SL tablet Place 1 tablet under the tongue as directed.      . simvastatin (ZOCOR) 10 MG tablet Take 10 mg by mouth at bedtime.        . valsartan (DIOVAN) 160 MG tablet Take 80 mg by mouth. 1/2 tablet daily         No Known Allergies  Past Medical History  Diagnosis Date  . Hypertension   . History of heart attack     posterior  . Hypercholesterolemia      History of hypercholesterolemia  . Diabetes mellitus, type 2   . Obesity   . Coronary artery disease April 2010    post stenting of the LAD  / chronic total occlusion of the left circumflex coronary and a high-grade stenosis in the second abtuse marginal vessel that is not amenable to angioplasty    Past Surgical History  Procedure Date  . Cardiac catheterization 01/17/2009    with stenting of the LAD  / chronic total occlusion of the left circumflex coronary and a high-grade  stenosis in the second abtuse marginal vessel that is not amenable to angioplasty  . Knee surgery     History  Smoking status  . Former Smoker  . Types: Cigarettes  . Quit date: 02/03/1986  Smokeless tobacco  . Not on file    History  Alcohol Use No    Family History  Problem Relation Age of Onset  . Stroke      strong family history of   . Heart disease      strong family history of  . Hypertension      strong family history of   . Hypertension Mother   . Heart attack Father     Review of Systems:  As noted in history of present illness. All other systems were reviewed and are negative.  Physical Exam: BP 130/82  Pulse 62  Ht 5\' 9"  (1.753 m)  Wt 231 lb 6.4 oz (104.962 kg)  BMI 34.17 kg/m2 He is a pleasant obese white male in no distress. HEENT exam is unremarkable. He has no JVD or bruits. Lungs are clear. Cardiac exam reveals a regular rate  and rhythm without gallop, murmur, or click. Abdomen is obese, soft, nontender. He has no significant edema. He does have a lot of superficial varicosities in his right lower extremity with some hyperpigmentation. Pedal pulses are good. Neurologic exam is nonfocal. LABORATORY DATA:   Assessment / Plan:

## 2011-09-08 ENCOUNTER — Other Ambulatory Visit: Payer: Self-pay | Admitting: Cardiology

## 2011-10-24 DIAGNOSIS — S93409A Sprain of unspecified ligament of unspecified ankle, initial encounter: Secondary | ICD-10-CM | POA: Diagnosis not present

## 2011-11-09 DIAGNOSIS — I10 Essential (primary) hypertension: Secondary | ICD-10-CM | POA: Diagnosis not present

## 2011-11-09 DIAGNOSIS — R05 Cough: Secondary | ICD-10-CM | POA: Diagnosis not present

## 2011-11-09 DIAGNOSIS — R059 Cough, unspecified: Secondary | ICD-10-CM | POA: Diagnosis not present

## 2011-11-09 DIAGNOSIS — J069 Acute upper respiratory infection, unspecified: Secondary | ICD-10-CM | POA: Diagnosis not present

## 2011-11-13 DIAGNOSIS — E785 Hyperlipidemia, unspecified: Secondary | ICD-10-CM | POA: Diagnosis not present

## 2011-11-13 DIAGNOSIS — E119 Type 2 diabetes mellitus without complications: Secondary | ICD-10-CM | POA: Diagnosis not present

## 2011-11-13 DIAGNOSIS — I10 Essential (primary) hypertension: Secondary | ICD-10-CM | POA: Diagnosis not present

## 2011-11-13 DIAGNOSIS — Z125 Encounter for screening for malignant neoplasm of prostate: Secondary | ICD-10-CM | POA: Diagnosis not present

## 2011-11-20 DIAGNOSIS — I10 Essential (primary) hypertension: Secondary | ICD-10-CM | POA: Diagnosis not present

## 2011-11-20 DIAGNOSIS — Z23 Encounter for immunization: Secondary | ICD-10-CM | POA: Diagnosis not present

## 2011-11-20 DIAGNOSIS — Z125 Encounter for screening for malignant neoplasm of prostate: Secondary | ICD-10-CM | POA: Diagnosis not present

## 2011-11-20 DIAGNOSIS — I2510 Atherosclerotic heart disease of native coronary artery without angina pectoris: Secondary | ICD-10-CM | POA: Diagnosis not present

## 2011-11-20 DIAGNOSIS — E1159 Type 2 diabetes mellitus with other circulatory complications: Secondary | ICD-10-CM | POA: Diagnosis not present

## 2011-11-20 DIAGNOSIS — Z0000 Encounter for general adult medical examination without abnormal findings: Secondary | ICD-10-CM | POA: Diagnosis not present

## 2012-01-06 DIAGNOSIS — M25579 Pain in unspecified ankle and joints of unspecified foot: Secondary | ICD-10-CM | POA: Diagnosis not present

## 2012-02-01 ENCOUNTER — Other Ambulatory Visit: Payer: Self-pay | Admitting: Cardiology

## 2012-03-11 ENCOUNTER — Encounter: Payer: Self-pay | Admitting: Cardiology

## 2012-03-11 ENCOUNTER — Other Ambulatory Visit: Payer: Self-pay | Admitting: Cardiology

## 2012-03-11 ENCOUNTER — Ambulatory Visit (INDEPENDENT_AMBULATORY_CARE_PROVIDER_SITE_OTHER): Payer: Medicare Other | Admitting: Cardiology

## 2012-03-11 VITALS — BP 136/80 | HR 58 | Ht 68.0 in | Wt 235.0 lb

## 2012-03-11 DIAGNOSIS — E119 Type 2 diabetes mellitus without complications: Secondary | ICD-10-CM | POA: Diagnosis not present

## 2012-03-11 DIAGNOSIS — E669 Obesity, unspecified: Secondary | ICD-10-CM | POA: Diagnosis not present

## 2012-03-11 DIAGNOSIS — I2510 Atherosclerotic heart disease of native coronary artery without angina pectoris: Secondary | ICD-10-CM

## 2012-03-11 DIAGNOSIS — I10 Essential (primary) hypertension: Secondary | ICD-10-CM

## 2012-03-11 NOTE — Assessment & Plan Note (Signed)
Have recommended a Mediterranean diet and focus on restriction of his carbohydrate intake.

## 2012-03-11 NOTE — Patient Instructions (Signed)
Continue your current medication  Try and lose weight.  I will see you again in 6 months.

## 2012-03-11 NOTE — Assessment & Plan Note (Signed)
Jon Huang remains asymptomatic. We will continue with his current medical therapy including aspirin, Plavix, beta blocker, ACE inhibitor, and amlodipine. We'll followup again in 6 months.

## 2012-03-11 NOTE — Progress Notes (Signed)
Jon Huang Date of Birth: 1946/09/03   History of Present Illness: Jon Huang is seen for followup today. He states he has been doing very well. He continues to exercise regularly. He also plays golf. He is still working 40 hours a week at [employer]. He denies any chest pain or shortness of breath. He reports his blood sugars have been under good control and that his recent lipid panel with Dr. Jacky Kindle was normal. He is still struggling with his weight. 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  Current Outpatient Prescriptions on File Prior to Visit  Medication Sig Dispense Refill  . amLODipine (NORVASC) 10 MG tablet Take 1 tablet by mouth Daily.      Marland Kitchen aspirin 325 MG tablet Take 325 mg by mouth daily.        . benazepril (LOTENSIN) 40 MG tablet Take 1 tablet by mouth Daily.      . hydrochlorothiazide 25 MG tablet Take 25 mg by mouth daily.        Marland Kitchen NITROSTAT 0.4 MG SL tablet Place 1 tablet under the tongue as directed.      Marland Kitchen NORMODYNE 200 MG tablet TAKE 1 TABLET TWICE DAILY.  62 each  4  . PLAVIX 75 MG tablet TAKE 1 TABLET EACH DAY.  30 each  5  . simvastatin (ZOCOR) 10 MG tablet Take 10 mg by mouth at bedtime.        . valsartan (DIOVAN) 160 MG tablet Take 160 mg by mouth.         No Known Allergies  Past Medical History  Diagnosis Date  . Hypertension   . History of heart attack     posterior  . Hypercholesterolemia      History of hypercholesterolemia  . Diabetes mellitus, type 2   . Obesity   . Coronary artery disease April 2010    post stenting of the LAD  / chronic total occlusion of the left circumflex coronary and a high-grade stenosis in the second abtuse marginal vessel that is not amenable to angioplasty    Past Surgical History  Procedure Date  . Cardiac catheterization 01/17/2009    with stenting of the LAD  / chronic total occlusion of the left  circumflex coronary and a high-grade stenosis in the second abtuse marginal vessel that is not amenable to angioplasty  . Knee surgery     History  Smoking status  . Former Smoker  . Types: Cigarettes  . Quit date: 02/03/1986  Smokeless tobacco  . Never Used    History  Alcohol Use No    Family History  Problem Relation Age of Onset  . Stroke      strong family history of   . Heart disease      strong family history of  . Hypertension      strong family history of   . Hypertension Mother   . Heart attack Father     Review of Systems:  As noted in history of present illness. All other systems were reviewed and are negative.  Physical Exam: BP 136/80  Pulse 58  Ht 5\' 8"  (1.727 m)  Wt 235 lb (106.595 kg)  BMI 35.73 kg/m2 He is a pleasant obese white male in no distress. HEENT exam is unremarkable. He has no JVD or bruits. Lungs are clear. Cardiac exam reveals a regular rate and rhythm  without gallop, murmur, or click. Abdomen is obese, soft, nontender. He has no significant edema. He does have a lot of superficial varicosities in his right lower extremity with some hyperpigmentation. Pedal pulses are good. Neurologic exam is nonfocal. LABORATORY DATA: ECG today shows sinus bradycardia with a rate of 58 beats per minute. It is otherwise normal.  Assessment / Plan:

## 2012-03-11 NOTE — Assessment & Plan Note (Signed)
Blood pressure is well controlled on his current medical regimen. 

## 2012-03-14 DIAGNOSIS — M6530 Trigger finger, unspecified finger: Secondary | ICD-10-CM | POA: Diagnosis not present

## 2012-06-02 DIAGNOSIS — I2510 Atherosclerotic heart disease of native coronary artery without angina pectoris: Secondary | ICD-10-CM | POA: Diagnosis not present

## 2012-06-02 DIAGNOSIS — I10 Essential (primary) hypertension: Secondary | ICD-10-CM | POA: Diagnosis not present

## 2012-06-02 DIAGNOSIS — E669 Obesity, unspecified: Secondary | ICD-10-CM | POA: Diagnosis not present

## 2012-06-02 DIAGNOSIS — E1159 Type 2 diabetes mellitus with other circulatory complications: Secondary | ICD-10-CM | POA: Diagnosis not present

## 2012-06-28 DIAGNOSIS — H3530 Unspecified macular degeneration: Secondary | ICD-10-CM | POA: Diagnosis not present

## 2012-07-11 DIAGNOSIS — IMO0002 Reserved for concepts with insufficient information to code with codable children: Secondary | ICD-10-CM | POA: Diagnosis not present

## 2012-07-11 DIAGNOSIS — M79609 Pain in unspecified limb: Secondary | ICD-10-CM | POA: Diagnosis not present

## 2012-07-22 DIAGNOSIS — M79609 Pain in unspecified limb: Secondary | ICD-10-CM | POA: Diagnosis not present

## 2012-07-27 DIAGNOSIS — IMO0002 Reserved for concepts with insufficient information to code with codable children: Secondary | ICD-10-CM | POA: Diagnosis not present

## 2012-08-02 DIAGNOSIS — IMO0002 Reserved for concepts with insufficient information to code with codable children: Secondary | ICD-10-CM | POA: Diagnosis not present

## 2012-08-05 DIAGNOSIS — S93409A Sprain of unspecified ligament of unspecified ankle, initial encounter: Secondary | ICD-10-CM | POA: Diagnosis not present

## 2012-08-08 DIAGNOSIS — M6530 Trigger finger, unspecified finger: Secondary | ICD-10-CM | POA: Diagnosis not present

## 2012-08-12 DIAGNOSIS — M79609 Pain in unspecified limb: Secondary | ICD-10-CM | POA: Diagnosis not present

## 2012-08-16 DIAGNOSIS — M79609 Pain in unspecified limb: Secondary | ICD-10-CM | POA: Diagnosis not present

## 2012-08-30 ENCOUNTER — Other Ambulatory Visit: Payer: Self-pay | Admitting: *Deleted

## 2012-08-30 MED ORDER — CLOPIDOGREL BISULFATE 75 MG PO TABS: 75.0000 mg | ORAL_TABLET | Freq: Every day | ORAL | Status: DC

## 2012-09-14 DIAGNOSIS — E1159 Type 2 diabetes mellitus with other circulatory complications: Secondary | ICD-10-CM | POA: Diagnosis not present

## 2012-09-14 DIAGNOSIS — I2510 Atherosclerotic heart disease of native coronary artery without angina pectoris: Secondary | ICD-10-CM | POA: Diagnosis not present

## 2012-09-14 DIAGNOSIS — E669 Obesity, unspecified: Secondary | ICD-10-CM | POA: Diagnosis not present

## 2012-09-14 DIAGNOSIS — I10 Essential (primary) hypertension: Secondary | ICD-10-CM | POA: Diagnosis not present

## 2012-10-13 ENCOUNTER — Other Ambulatory Visit: Payer: Self-pay | Admitting: *Deleted

## 2012-10-13 MED ORDER — LABETALOL HCL 200 MG PO TABS: 200.0000 mg | ORAL_TABLET | Freq: Two times a day (BID) | ORAL | Status: DC

## 2012-10-13 NOTE — Telephone Encounter (Signed)
Fax Received. Refill Completed. Jon Huang (R.M.A)   

## 2012-11-10 ENCOUNTER — Telehealth: Payer: Self-pay | Admitting: Family Medicine

## 2012-11-10 NOTE — Telephone Encounter (Signed)
Pt would like to know if you would you would except him, his wife and their mother. Each of them has MEDICARE. Pt wanted to let you know Alba Destine referred them.

## 2012-11-11 NOTE — Telephone Encounter (Signed)
Unfortunately, I am unable to take on any new Medicare at this time.  We could offer to another provider here if they are interested.

## 2012-11-14 NOTE — Telephone Encounter (Signed)
Pt aware.

## 2012-11-18 DIAGNOSIS — E7800 Pure hypercholesterolemia, unspecified: Secondary | ICD-10-CM | POA: Diagnosis not present

## 2012-11-18 DIAGNOSIS — H9190 Unspecified hearing loss, unspecified ear: Secondary | ICD-10-CM | POA: Diagnosis not present

## 2012-11-18 DIAGNOSIS — I2510 Atherosclerotic heart disease of native coronary artery without angina pectoris: Secondary | ICD-10-CM | POA: Diagnosis not present

## 2012-11-18 DIAGNOSIS — Z0000 Encounter for general adult medical examination without abnormal findings: Secondary | ICD-10-CM | POA: Diagnosis not present

## 2012-11-18 DIAGNOSIS — I10 Essential (primary) hypertension: Secondary | ICD-10-CM | POA: Diagnosis not present

## 2012-11-18 DIAGNOSIS — Z125 Encounter for screening for malignant neoplasm of prostate: Secondary | ICD-10-CM | POA: Diagnosis not present

## 2012-11-18 DIAGNOSIS — N400 Enlarged prostate without lower urinary tract symptoms: Secondary | ICD-10-CM | POA: Diagnosis not present

## 2012-12-06 DIAGNOSIS — H903 Sensorineural hearing loss, bilateral: Secondary | ICD-10-CM | POA: Diagnosis not present

## 2012-12-06 DIAGNOSIS — Z0000 Encounter for general adult medical examination without abnormal findings: Secondary | ICD-10-CM | POA: Diagnosis not present

## 2012-12-06 DIAGNOSIS — H908 Mixed conductive and sensorineural hearing loss, unspecified: Secondary | ICD-10-CM | POA: Diagnosis not present

## 2012-12-06 DIAGNOSIS — E119 Type 2 diabetes mellitus without complications: Secondary | ICD-10-CM | POA: Diagnosis not present

## 2013-01-18 DIAGNOSIS — M6530 Trigger finger, unspecified finger: Secondary | ICD-10-CM | POA: Diagnosis not present

## 2013-01-27 ENCOUNTER — Other Ambulatory Visit: Payer: Self-pay | Admitting: Cardiology

## 2013-02-27 ENCOUNTER — Encounter: Payer: Self-pay | Admitting: Cardiology

## 2013-03-20 ENCOUNTER — Encounter: Payer: Self-pay | Admitting: Cardiology

## 2013-04-22 ENCOUNTER — Other Ambulatory Visit: Payer: Self-pay | Admitting: Cardiology

## 2013-05-08 DIAGNOSIS — M6530 Trigger finger, unspecified finger: Secondary | ICD-10-CM | POA: Diagnosis not present

## 2013-05-23 ENCOUNTER — Other Ambulatory Visit: Payer: Self-pay | Admitting: Cardiology

## 2013-06-02 DIAGNOSIS — E119 Type 2 diabetes mellitus without complications: Secondary | ICD-10-CM | POA: Diagnosis not present

## 2013-06-02 DIAGNOSIS — I10 Essential (primary) hypertension: Secondary | ICD-10-CM | POA: Diagnosis not present

## 2013-06-02 DIAGNOSIS — E7800 Pure hypercholesterolemia, unspecified: Secondary | ICD-10-CM | POA: Diagnosis not present

## 2013-06-02 DIAGNOSIS — I2510 Atherosclerotic heart disease of native coronary artery without angina pectoris: Secondary | ICD-10-CM | POA: Diagnosis not present

## 2013-06-25 ENCOUNTER — Other Ambulatory Visit: Payer: Self-pay | Admitting: Cardiology

## 2013-08-18 DIAGNOSIS — L821 Other seborrheic keratosis: Secondary | ICD-10-CM | POA: Diagnosis not present

## 2013-08-18 DIAGNOSIS — D1801 Hemangioma of skin and subcutaneous tissue: Secondary | ICD-10-CM | POA: Diagnosis not present

## 2013-08-18 DIAGNOSIS — D485 Neoplasm of uncertain behavior of skin: Secondary | ICD-10-CM | POA: Diagnosis not present

## 2013-08-18 DIAGNOSIS — D239 Other benign neoplasm of skin, unspecified: Secondary | ICD-10-CM | POA: Diagnosis not present

## 2013-08-18 DIAGNOSIS — L570 Actinic keratosis: Secondary | ICD-10-CM | POA: Diagnosis not present

## 2013-08-18 DIAGNOSIS — B079 Viral wart, unspecified: Secondary | ICD-10-CM | POA: Diagnosis not present

## 2013-08-18 DIAGNOSIS — L820 Inflamed seborrheic keratosis: Secondary | ICD-10-CM | POA: Diagnosis not present

## 2013-09-08 ENCOUNTER — Other Ambulatory Visit: Payer: Self-pay | Admitting: Cardiology

## 2013-10-19 DIAGNOSIS — M6530 Trigger finger, unspecified finger: Secondary | ICD-10-CM | POA: Diagnosis not present

## 2013-10-30 DIAGNOSIS — L98499 Non-pressure chronic ulcer of skin of other sites with unspecified severity: Secondary | ICD-10-CM | POA: Diagnosis not present

## 2013-10-30 DIAGNOSIS — L570 Actinic keratosis: Secondary | ICD-10-CM | POA: Diagnosis not present

## 2013-10-30 DIAGNOSIS — L820 Inflamed seborrheic keratosis: Secondary | ICD-10-CM | POA: Diagnosis not present

## 2013-11-01 DIAGNOSIS — M25569 Pain in unspecified knee: Secondary | ICD-10-CM | POA: Diagnosis not present

## 2013-11-13 DIAGNOSIS — J111 Influenza due to unidentified influenza virus with other respiratory manifestations: Secondary | ICD-10-CM | POA: Diagnosis not present

## 2013-11-13 DIAGNOSIS — R509 Fever, unspecified: Secondary | ICD-10-CM | POA: Diagnosis not present

## 2013-12-01 DIAGNOSIS — Z125 Encounter for screening for malignant neoplasm of prostate: Secondary | ICD-10-CM | POA: Diagnosis not present

## 2013-12-01 DIAGNOSIS — I2510 Atherosclerotic heart disease of native coronary artery without angina pectoris: Secondary | ICD-10-CM | POA: Diagnosis not present

## 2013-12-01 DIAGNOSIS — I10 Essential (primary) hypertension: Secondary | ICD-10-CM | POA: Diagnosis not present

## 2013-12-01 DIAGNOSIS — E7800 Pure hypercholesterolemia, unspecified: Secondary | ICD-10-CM | POA: Diagnosis not present

## 2013-12-01 DIAGNOSIS — Z23 Encounter for immunization: Secondary | ICD-10-CM | POA: Diagnosis not present

## 2013-12-01 DIAGNOSIS — Z0000 Encounter for general adult medical examination without abnormal findings: Secondary | ICD-10-CM | POA: Diagnosis not present

## 2013-12-01 DIAGNOSIS — N400 Enlarged prostate without lower urinary tract symptoms: Secondary | ICD-10-CM | POA: Diagnosis not present

## 2013-12-01 DIAGNOSIS — E119 Type 2 diabetes mellitus without complications: Secondary | ICD-10-CM | POA: Diagnosis not present

## 2014-02-11 ENCOUNTER — Other Ambulatory Visit: Payer: Self-pay | Admitting: Cardiology

## 2014-02-27 ENCOUNTER — Encounter: Payer: Medicare Other | Attending: Family Medicine | Admitting: *Deleted

## 2014-02-27 ENCOUNTER — Encounter: Payer: Self-pay | Admitting: *Deleted

## 2014-02-27 VITALS — Ht 67.75 in | Wt 245.1 lb

## 2014-02-27 DIAGNOSIS — Z794 Long term (current) use of insulin: Secondary | ICD-10-CM | POA: Insufficient documentation

## 2014-02-27 DIAGNOSIS — E119 Type 2 diabetes mellitus without complications: Secondary | ICD-10-CM | POA: Diagnosis not present

## 2014-02-27 NOTE — Progress Notes (Signed)
Appt start time: 1630 end time:  1700.  Assessment:  Patient was seen on  02/27/14 for individual diabetes education. Mr. Jon Huang present with his wife  Jon Huang for DSME. 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 His wife does the grocery shopping and cooking. She notes that she is able to control the food consumed at home but when Jon Huang is traveling she goes to [employer]. At the retirement community she eats lunch in the dining room with the residents. This does provide him with good options/choices.  Current HbA1c: 7.7%  Preferred Learning Style:   No preference indicated   Learning Readiness:   Ready   MEDICATIONS: See List: Metformin 500mg  BID prescribed. However patient noted that he usually forgets to take the evening dose. It is therefor omitted. He asked if it would be OK to take both pills in the morning. I advised that it would be better to take both in the morning vs. Only getting one dose in. He does have a history of GI distress from taking 1000mg  at a time. I encouraged him to take with food.  DIETARY INTAKE:  Usual eating pattern includes 3 meals and 0 snacks per day.  B ( AM): Egg McMuffin, sausage/egg mcmuffin, eggs, potatoes, bagel,  Snk ( AM):  none L ( PM): pork roast, brown gravy, sweet potato, corn, chocolate pudding Snk ( PM): none D ( PM): fish/chicken/pasta, vegetables, salade Snk ( PM): fruit, frozen yogurt Beverages: ice tea/decaf/splenda, water, diet soda  Usual physical activity: no structured activity, physically active at work daily    Intervention:  Nutrition counseling provided.  Discussed diabetes disease process and treatment options.  Discussed physiology of diabetes and role of obesity on insulin resistance.  Encouraged moderate weight reduction to improve glucose levels.  Discussed role of medications and diet in glucose control  Provided education on macronutrients on glucose levels.  Provided  education on carb counting, importance of regularly scheduled meals/snacks, and meal planning  Discussed effects of physical activity on glucose levels and long-term glucose control.  Recommended 150 minutes of physical activity/week.  Reviewed patient medications.  Discussed role of medication on blood glucose and possible side effects  Discussed blood glucose monitoring and interpretation.  Discussed recommended target ranges and individual ranges.    Described short-term complications: hyper- and hypo-glycemia.  Discussed causes,symptoms, and treatment options.  Discussed prevention, detection, and treatment of long-term complications.  Discussed the role of prolonged elevated glucose levels on body systems.  Discussed role of stress on blood glucose levels and discussed strategies to manage psychosocial issues.  Discussed recommendations for long-term diabetes self-care.  Established checklist for medical, dental, and emotional self-care.  Plan:  Aim for 3 Carb Choices per meal (45 grams) +/- 1 either way  Aim for 0-15 Carbs per snack if hungry  Include protein in moderation with your meals and snacks Consider reading food labels for Total Carbohydrate and Fat Grams of foods Consider  increasing your activity level by walking for 30 minutes daily as tolerated Consider checking BG at alternate times per day to include first thing in the morning and 2 hours after dinner as directed by MD  Consider taking Metformin 1000 in the morning with FOOD as directed by MD  Consider Jon Huang reduced calorie bread (61-60)VPX per slice Consider Jon Huang Protein Bars Light and Fit yogurt is one serving  Decrease your servings of pasta  Teaching Method Utilized:  [employer] Hands on  Handouts given during visit include: Carb Counting and Food Label handouts Meal Plan Card  Snack sheet  Barriers to learning/adherence to lifestyle change: Time management  Diabetes self-care  support plan:   [facility] support group  family  Demonstrated degree of understanding via:  Teach Back   Monitoring/Evaluation:  Dietary intake, exercise, test glucose, and body weight prn.

## 2014-02-27 NOTE — Patient Instructions (Signed)
Plan:  Aim for 3 Carb Choices per meal (45 grams) +/- 1 either way  Aim for 0-15 Carbs per snack if hungry  Include protein in moderation with your meals and snacks Consider reading food labels for Total Carbohydrate and Fat Grams of foods Consider  increasing your activity level by walking for 30 minutes daily as tolerated Consider checking BG at alternate times per day to include first thing in the morning and 2 hours after dinner as directed by MD  Consider taking Metformin 1000 in the morning with FOOD as directed by MD  Consider Lynnae Sandhoff reduced calorie bread (17-79)TJQ per slice Consider [facility] Protein Bars Light and Fit yogurt is one serving  Decrease your servings of pasta

## 2014-03-09 DIAGNOSIS — E119 Type 2 diabetes mellitus without complications: Secondary | ICD-10-CM | POA: Diagnosis not present

## 2014-03-09 DIAGNOSIS — E7800 Pure hypercholesterolemia, unspecified: Secondary | ICD-10-CM | POA: Diagnosis not present

## 2014-03-09 DIAGNOSIS — I10 Essential (primary) hypertension: Secondary | ICD-10-CM | POA: Diagnosis not present

## 2014-03-09 DIAGNOSIS — I2510 Atherosclerotic heart disease of native coronary artery without angina pectoris: Secondary | ICD-10-CM | POA: Diagnosis not present

## 2014-03-16 DIAGNOSIS — D1801 Hemangioma of skin and subcutaneous tissue: Secondary | ICD-10-CM | POA: Diagnosis not present

## 2014-03-16 DIAGNOSIS — L570 Actinic keratosis: Secondary | ICD-10-CM | POA: Diagnosis not present

## 2014-03-16 DIAGNOSIS — L821 Other seborrheic keratosis: Secondary | ICD-10-CM | POA: Diagnosis not present

## 2014-03-16 DIAGNOSIS — L723 Sebaceous cyst: Secondary | ICD-10-CM | POA: Diagnosis not present

## 2014-06-08 DIAGNOSIS — I2510 Atherosclerotic heart disease of native coronary artery without angina pectoris: Secondary | ICD-10-CM | POA: Diagnosis not present

## 2014-06-08 DIAGNOSIS — E119 Type 2 diabetes mellitus without complications: Secondary | ICD-10-CM | POA: Diagnosis not present

## 2014-06-08 DIAGNOSIS — I10 Essential (primary) hypertension: Secondary | ICD-10-CM | POA: Diagnosis not present

## 2014-06-08 DIAGNOSIS — E7800 Pure hypercholesterolemia, unspecified: Secondary | ICD-10-CM | POA: Diagnosis not present

## 2014-06-08 DIAGNOSIS — G479 Sleep disorder, unspecified: Secondary | ICD-10-CM | POA: Diagnosis not present

## 2014-06-11 ENCOUNTER — Telehealth: Payer: Self-pay | Admitting: Cardiology

## 2014-06-11 NOTE — Telephone Encounter (Signed)
Returned call to patient's wife no answer.LMTC. 

## 2014-06-11 NOTE — Telephone Encounter (Signed)
New message     Pt's PCP want pt to check with Dr Martinique to see if he need to stay on plavix and aspirin because of the possibility of a bleed

## 2014-06-12 NOTE — Telephone Encounter (Signed)
Returned call to patient's wife she stated husband saw PCP yesterday for a follow up visit and he was concerned about a possible bleed with plavix and aspirin.Will talk to [place] and call you back.Follow up appointment scheduled with Dr.Jordan 07/10/14 at 4:30 pm.

## 2014-06-13 NOTE — Telephone Encounter (Signed)
Returned call to patient's wife Dr.Jordan advised to continue to take plavix and aspirin,will discuss at 07/10/14 office visit with him.

## 2014-06-21 ENCOUNTER — Other Ambulatory Visit: Payer: Self-pay | Admitting: Cardiology

## 2014-07-10 ENCOUNTER — Ambulatory Visit (INDEPENDENT_AMBULATORY_CARE_PROVIDER_SITE_OTHER): Payer: Medicare Other | Admitting: Cardiology

## 2014-07-10 ENCOUNTER — Encounter: Payer: Self-pay | Admitting: Cardiology

## 2014-07-10 VITALS — BP 132/82 | HR 64 | Ht 68.0 in | Wt 226.4 lb

## 2014-07-10 DIAGNOSIS — E669 Obesity, unspecified: Secondary | ICD-10-CM | POA: Diagnosis not present

## 2014-07-10 DIAGNOSIS — I2510 Atherosclerotic heart disease of native coronary artery without angina pectoris: Secondary | ICD-10-CM

## 2014-07-10 DIAGNOSIS — I10 Essential (primary) hypertension: Secondary | ICD-10-CM

## 2014-07-10 DIAGNOSIS — E119 Type 2 diabetes mellitus without complications: Secondary | ICD-10-CM

## 2014-07-10 MED ORDER — ASPIRIN EC 81 MG PO TBEC: 81.0000 mg | DELAYED_RELEASE_TABLET | Freq: Every day | ORAL | Status: AC

## 2014-07-10 NOTE — Patient Instructions (Signed)
Stop taking Plavix.  Reduce ASA to 81 mg daily  Keep working on weight loss and exercise  We will schedule you for a nuclear stress test.

## 2014-07-10 NOTE — Progress Notes (Signed)
Livia Snellen [place]aus Date of Birth: [birth year]   History of Present Illness: Ed is seen for followup today. He has a history of CAD. He is s/p posterior MI in 1997 with occlusion of the LCx at that time. He was treated medically. In 2010 he had an abnormal stress test and cardiac cath demonstrated a severe proximal LAD stenosis. The LCx occlusion was collateralized. He had stenting of the LAD with a 3.5 x 15 mm Xience DES. We attempted to open the LCx. Despite crossing with a wire the lesion could not be dilated beyond a 2.0 mm balloon and the procedure was aborted.   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 On follow up today he reports he feels well. He was started on metformin for DM. He has lost 9 lbs with dietary modification. He denies any chest pain or SOB. He walks a lot. He does have snoring and falls asleep easily. He is scheduled for a sleep study. He complains of easy bruising.   Current Outpatient Prescriptions on File Prior to Visit  Medication Sig Dispense Refill  . amLODipine (NORVASC) 10 MG tablet Take 1 tablet by mouth Daily.      . benazepril (LOTENSIN) 40 MG tablet Take 1 tablet by mouth Daily.      . hydrochlorothiazide 25 MG tablet Take 25 mg by mouth daily.        Marland Kitchen labetalol (NORMODYNE) 200 MG tablet TAKE 1 TABLET TWICE DAILY.  60 tablet  0  . NITROSTAT 0.4 MG SL tablet 1 TAB UNDER TONGUE AS NEEDED FOR CHEST PAIN. MAY REPEAT EVERY 5 MIN FOR A TOTAL OF 3 DOSES.  25 tablet  0  . simvastatin (ZOCOR) 10 MG tablet Take 10 mg by mouth at bedtime.         No current facility-administered medications on file prior to visit.    No Known Allergies  Past Medical History  Diagnosis Date  . Hypertension   . History of heart attack     posterior  . Hypercholesterolemia      History of hypercholesterolemia  . Diabetes mellitus, type 2   . Obesity   . Coronary  artery disease April 2010    post stenting of the LAD  / chronic total occlusion of the left circumflex coronary and a high-grade stenosis in the second abtuse marginal vessel that is not amenable to angioplasty    Past Surgical History  Procedure Laterality Date  . Cardiac catheterization  01/17/2009    with stenting of the LAD  / chronic total occlusion of the left circumflex coronary and a high-grade stenosis in the second abtuse marginal vessel that is not amenable to angioplasty  . Knee surgery      History  Smoking status  . Former Smoker  . Types: Cigarettes  . Quit date: 02/03/1986  Smokeless tobacco  . Never Used    History  Alcohol Use No    Family History  Problem Relation Age of Onset  . Stroke      strong family history of   . Heart disease      strong family history of  . Hypertension      strong family history of   . Hypertension Mother   . Heart attack Father     Review of Systems:  As noted in history of present illness. All other systems were  reviewed and are negative.  Physical Exam: BP 132/82  Pulse 64  Ht 5\' 8"  (1.727 m)  Wt 226 lb 6.4 oz (102.694 kg)  BMI 34.43 kg/m2 He is a pleasant obese white male in no distress. HEENT exam is unremarkable. He has no JVD or bruits. Lungs are clear. Cardiac exam reveals a regular rate and rhythm without gallop, murmur, or click. Abdomen is obese, soft, nontender. He has no significant edema. He does have some superficial varicosities in his right lower extremity with some hyperpigmentation. Pedal pulses are good. Neurologic exam is nonfocal.  LABORATORY DATA: ECG today shows sinus bradycardia with a rate of 64 beats per minute.RBBB which is new since 2013.  Assessment / Plan: 1.CAD. S/p DES of proximal LAD in 2010. Chronic occlusion of mid LCx. Asymptomatic. Will update stress Myoview. Continue ASA 81 mg. Stop Plavix. Continue beta blocker and amlodipine.  2. HTN under fair control. Continue Normodyne,  amlodipine, HCTZ, and valsartan  3. DM type 2 on metformin.  4. Obesity. Possible sleep apnea. For sleep study.  5. Hyperlipidemia. On Zocor.  6. RBBB- new. Probably benign. Will await stress test results.

## 2014-07-11 ENCOUNTER — Telehealth (HOSPITAL_COMMUNITY): Payer: Self-pay | Admitting: *Deleted

## 2014-07-20 ENCOUNTER — Telehealth (HOSPITAL_COMMUNITY): Payer: Self-pay

## 2014-07-20 NOTE — Telephone Encounter (Signed)
Encounter complete. 

## 2014-07-23 ENCOUNTER — Telehealth (HOSPITAL_COMMUNITY): Payer: Self-pay | Admitting: *Deleted

## 2014-07-24 ENCOUNTER — Telehealth (HOSPITAL_COMMUNITY): Payer: Self-pay

## 2014-07-24 NOTE — Telephone Encounter (Signed)
Encounter complete. 

## 2014-07-25 ENCOUNTER — Ambulatory Visit (HOSPITAL_COMMUNITY)
Admission: RE | Admit: 2014-07-25 | Discharge: 2014-07-25 | Disposition: A | Discharge status: Home / Self Care | Payer: Medicare Other | Source: Ambulatory Visit | Attending: Cardiology | Admitting: Cardiology

## 2014-07-25 DIAGNOSIS — E669 Obesity, unspecified: Secondary | ICD-10-CM | POA: Insufficient documentation

## 2014-07-25 DIAGNOSIS — E119 Type 2 diabetes mellitus without complications: Secondary | ICD-10-CM | POA: Diagnosis not present

## 2014-07-25 DIAGNOSIS — Z87891 Personal history of nicotine dependence: Secondary | ICD-10-CM | POA: Diagnosis not present

## 2014-07-25 DIAGNOSIS — I2510 Atherosclerotic heart disease of native coronary artery without angina pectoris: Secondary | ICD-10-CM | POA: Diagnosis not present

## 2014-07-25 DIAGNOSIS — Z8249 Family history of ischemic heart disease and other diseases of the circulatory system: Secondary | ICD-10-CM | POA: Diagnosis not present

## 2014-07-25 DIAGNOSIS — E785 Hyperlipidemia, unspecified: Secondary | ICD-10-CM | POA: Insufficient documentation

## 2014-07-25 DIAGNOSIS — I10 Essential (primary) hypertension: Secondary | ICD-10-CM | POA: Diagnosis not present

## 2014-07-25 DIAGNOSIS — R5383 Other fatigue: Secondary | ICD-10-CM | POA: Insufficient documentation

## 2014-07-25 DIAGNOSIS — I4510 Unspecified right bundle-branch block: Secondary | ICD-10-CM | POA: Diagnosis not present

## 2014-07-25 MED ORDER — TECHNETIUM TC 99M SESTAMIBI GENERIC - CARDIOLITE
10.0000 | Freq: Once | INTRAVENOUS | Status: AC | PRN
  Administered 2014-07-25: 10 via INTRAVENOUS

## 2014-07-25 MED ORDER — TECHNETIUM TC 99M SESTAMIBI GENERIC - CARDIOLITE
30.0000 | Freq: Once | INTRAVENOUS | Status: AC | PRN
  Administered 2014-07-25: 30 via INTRAVENOUS

## 2014-07-25 NOTE — Procedures (Addendum)
[facility] [address] [place] [address] [phone]  Cardiology Nuclear Med Study  Jon Huang is a 68 y.o. male     MRN : [id number]     DOB: 10-18-1945  Procedure Date: 07/25/2014  Nuclear Med Background Indication for Stress Test:  Stent Patency History:  CAD;MI-1997;STENT/PTCA-01/2009;Last NUC MPI on 02/18/2011-nonischemic;EF=49% Cardiac Risk Factors: Family History - CAD, History of Smoking, Hypertension, Lipids, NIDDM, Obesity and RBBB  Symptoms:  Fatigue   Nuclear Pre-Procedure Caffeine/Decaff Intake:  7:00pm NPO After: 5:00am   IV Site: R Forearm  IV 0.9% NS with Angio Cath:  22g  Chest Size (in):  48"  IV Started by: Rolene Course, RN  Height: 5\' 8"  (1.727 m)  Cup Size: n/a  BMI:  Body mass index is 34.37 kg/(m^2). Weight:  226 lb (102.513 kg)   Tech Comments:  n/a    Nuclear Med Study 1 or 2 day study: 1 day  Stress Test Type:  Stress  Order Authorizing Provider:  Peter Martinique, MD   Resting Radionuclide: Technetium 38m Sestamibi  Resting Radionuclide Dose: 10.4 mCi   Stress Radionuclide:  Technetium 61m Sestamibi  Stress Radionuclide Dose: 31.6 mCi           Stress Protocol Rest HR: 58 Stress HR: 136  Rest BP: 144/89 Stress BP: 188/84  Exercise Time (min): 8:31 METS: 9.90   Predicted Max HR: 152 bpm % Max HR: 89.47 bpm Rate Pressure Product: 25568  Dose of Adenosine (mg):  n/a Dose of Lexiscan: n/a mg  Dose of Atropine (mg): n/a Dose of Dobutamine: n/a mcg/kg/min (at max HR)  Stress Test Technologist: Mellody Memos, CCT Nuclear Technologist: Otho Perl, CNMT   Rest Procedure:  Myocardial perfusion imaging was performed at rest 45 minutes following the intravenous administration of Technetium 22m Sestamibi. Stress Procedure:  The patient performed treadmill exercise using a Bruce  Protocol for 8 minutes 31 seconds. The patient stopped due to fatigue and shortness of breath. Patient  denied any chest pain.  There were significant ST-T wave changes.  Technetium 60m Sestamibi was injected IV at peak exercise and myocardial perfusion imaging was performed after a brief delay.  Transient Ischemic Dilatation (Normal <1.22):  1.38 QGS EDV:  134 ml QGS ESV:  64 ml LV Ejection Fraction: 52%      Rest ECG: NSR - Normal EKG and NSR-RBBB  Stress ECG: RBBB resolves with exercise and increasing heart rate. Significant ST abnormalities consistent with ischemia. 1-1.5 mm ST depression, primarily in the inferior leads, less significant in V5.  QPS Raw Data Images:  Normal; no motion artifact; normal heart/lung ratio. Stress Images:  there is a large area of severely reduced uptake in the inferior wall Rest Images:  there is slight improvement in perfusion at the lateral border of the large inferior defect Subtraction (SDS):  There is a fixed defect that is most consistent with a previous infarction. LV Wall Motion:  mid-basal inferior wall hypokinesis and borderline overall LV function (EF 52%)  Impression Exercise Capacity:  Good exercise capacity. BP Response:  Normal blood pressure response. Clinical Symptoms:  No significant symptoms noted. ECG Impression:  Significant ST abnormalities consistent with ischemia. Comparison with Prior Nuclear Study: direct image comparison to 2010 and 2012 shows identical perfusion pattern   Overall Impression:  Low risk stress nuclear study with a large inferior wall scar with mild peri-infarct inferolateral ischemia.   Sanda Klein, MD  07/25/2014 12:52 PM

## 2014-08-24 DIAGNOSIS — Z23 Encounter for immunization: Secondary | ICD-10-CM | POA: Diagnosis not present

## 2014-09-14 DIAGNOSIS — E780 Pure hypercholesterolemia: Secondary | ICD-10-CM | POA: Diagnosis not present

## 2014-09-14 DIAGNOSIS — I10 Essential (primary) hypertension: Secondary | ICD-10-CM | POA: Diagnosis not present

## 2014-09-14 DIAGNOSIS — E119 Type 2 diabetes mellitus without complications: Secondary | ICD-10-CM | POA: Diagnosis not present

## 2014-09-14 DIAGNOSIS — I2510 Atherosclerotic heart disease of native coronary artery without angina pectoris: Secondary | ICD-10-CM | POA: Diagnosis not present

## 2014-09-20 DIAGNOSIS — G4733 Obstructive sleep apnea (adult) (pediatric): Secondary | ICD-10-CM | POA: Diagnosis not present

## 2014-12-20 DIAGNOSIS — G4733 Obstructive sleep apnea (adult) (pediatric): Secondary | ICD-10-CM | POA: Diagnosis not present

## 2014-12-21 DIAGNOSIS — E119 Type 2 diabetes mellitus without complications: Secondary | ICD-10-CM | POA: Diagnosis not present

## 2014-12-21 DIAGNOSIS — I10 Essential (primary) hypertension: Secondary | ICD-10-CM | POA: Diagnosis not present

## 2014-12-21 DIAGNOSIS — Z0000 Encounter for general adult medical examination without abnormal findings: Secondary | ICD-10-CM | POA: Diagnosis not present

## 2014-12-21 DIAGNOSIS — Z125 Encounter for screening for malignant neoplasm of prostate: Secondary | ICD-10-CM | POA: Diagnosis not present

## 2014-12-21 DIAGNOSIS — Z1211 Encounter for screening for malignant neoplasm of colon: Secondary | ICD-10-CM | POA: Diagnosis not present

## 2014-12-21 DIAGNOSIS — E780 Pure hypercholesterolemia: Secondary | ICD-10-CM | POA: Diagnosis not present

## 2015-04-08 DIAGNOSIS — G4733 Obstructive sleep apnea (adult) (pediatric): Secondary | ICD-10-CM | POA: Diagnosis not present

## 2015-04-08 DIAGNOSIS — E119 Type 2 diabetes mellitus without complications: Secondary | ICD-10-CM | POA: Diagnosis not present

## 2015-04-08 DIAGNOSIS — I2510 Atherosclerotic heart disease of native coronary artery without angina pectoris: Secondary | ICD-10-CM | POA: Diagnosis not present

## 2015-04-08 DIAGNOSIS — E780 Pure hypercholesterolemia: Secondary | ICD-10-CM | POA: Diagnosis not present

## 2015-04-08 DIAGNOSIS — I10 Essential (primary) hypertension: Secondary | ICD-10-CM | POA: Diagnosis not present

## 2015-04-11 DIAGNOSIS — L812 Freckles: Secondary | ICD-10-CM | POA: Diagnosis not present

## 2015-04-11 DIAGNOSIS — D1801 Hemangioma of skin and subcutaneous tissue: Secondary | ICD-10-CM | POA: Diagnosis not present

## 2015-04-11 DIAGNOSIS — L570 Actinic keratosis: Secondary | ICD-10-CM | POA: Diagnosis not present

## 2015-04-11 DIAGNOSIS — L821 Other seborrheic keratosis: Secondary | ICD-10-CM | POA: Diagnosis not present

## 2015-05-31 DIAGNOSIS — D124 Benign neoplasm of descending colon: Secondary | ICD-10-CM | POA: Diagnosis not present

## 2015-05-31 DIAGNOSIS — D123 Benign neoplasm of transverse colon: Secondary | ICD-10-CM | POA: Diagnosis not present

## 2015-05-31 DIAGNOSIS — D122 Benign neoplasm of ascending colon: Secondary | ICD-10-CM | POA: Diagnosis not present

## 2015-05-31 DIAGNOSIS — D126 Benign neoplasm of colon, unspecified: Secondary | ICD-10-CM | POA: Diagnosis not present

## 2015-05-31 DIAGNOSIS — Z1211 Encounter for screening for malignant neoplasm of colon: Secondary | ICD-10-CM | POA: Diagnosis not present

## 2015-07-11 DIAGNOSIS — Z23 Encounter for immunization: Secondary | ICD-10-CM | POA: Diagnosis not present

## 2015-07-11 DIAGNOSIS — I10 Essential (primary) hypertension: Secondary | ICD-10-CM | POA: Diagnosis not present

## 2015-07-11 DIAGNOSIS — E780 Pure hypercholesterolemia: Secondary | ICD-10-CM | POA: Diagnosis not present

## 2015-07-11 DIAGNOSIS — M542 Cervicalgia: Secondary | ICD-10-CM | POA: Diagnosis not present

## 2015-07-11 DIAGNOSIS — E119 Type 2 diabetes mellitus without complications: Secondary | ICD-10-CM | POA: Diagnosis not present

## 2015-07-28 ENCOUNTER — Other Ambulatory Visit: Payer: Self-pay | Admitting: Cardiology

## 2015-07-29 NOTE — Telephone Encounter (Signed)
Rx request sent to pharmacy.  

## 2015-08-12 ENCOUNTER — Telehealth: Payer: Self-pay | Admitting: Cardiology

## 2015-08-13 NOTE — Telephone Encounter (Signed)
Close encounter 

## 2015-09-03 ENCOUNTER — Telehealth: Payer: Self-pay

## 2015-09-03 NOTE — Telephone Encounter (Signed)
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

## 2015-09-11 ENCOUNTER — Ambulatory Visit: Payer: PRIVATE HEALTH INSURANCE | Admitting: Physician Assistant

## 2015-09-26 ENCOUNTER — Telehealth: Payer: Self-pay | Admitting: Cardiology

## 2015-09-26 NOTE — Telephone Encounter (Signed)
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

## 2015-09-26 NOTE — Telephone Encounter (Signed)
New Message     Pt calling stating that he is doing a DOT physical and needs an Echo, there is no order in Epic for this. Pt also wants to possibly move up Dr. Martinique appt to the same day as Echo. Pt also wants to know how this will affect his insurance and if they will cover it since he needs this for his job. Please call back and advise.

## 2015-10-01 ENCOUNTER — Encounter: Payer: Self-pay | Admitting: Physician Assistant

## 2015-10-01 ENCOUNTER — Ambulatory Visit (INDEPENDENT_AMBULATORY_CARE_PROVIDER_SITE_OTHER): Payer: Medicare Other | Admitting: Physician Assistant

## 2015-10-01 VITALS — BP 152/90 | HR 60 | Ht 69.0 in | Wt 253.5 lb

## 2015-10-01 DIAGNOSIS — I2510 Atherosclerotic heart disease of native coronary artery without angina pectoris: Secondary | ICD-10-CM | POA: Diagnosis not present

## 2015-10-01 NOTE — Progress Notes (Signed)
Cardiology Office Note   Date:  10/01/2015   ID:  Jon Huang 12-Jun-1946, MRN [id number]  PCP:  Jon Pepper, MD  Cardiologist:  Jon Huang  Jon Lecompte, PA-C   Chief Complaint  Patient presents with  . Follow-up    occassional chest pain, no shortness of breath, no edmeda, no pain or cramping in legs, no lightheaded or dizziness    History of Present Illness: Jon Huang is a 69 y.o. male with a history of MI 67 w/ CFX 100% after OM3, Cath 2010 w/ 3.5- x 15-mm XIENCE DES LAD and 1.89mm balloon PTCA mCFX, HTN, HL, NIDDM, RBBB, obesity, tob use, FH CAD, MV 2012 w/out ischemia, EF 49%. MV 07/25/2014 w/ lg inf scar, no ischemia, EF 52%  Jon Huang presents for evaluation of his CAD.  Jon Huang is generally active and able to do what he wishes to do. He plays golf once a week. 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 He does not exercise regularly but has no problems with chest pain or dyspnea on exertion when he does his usual activities. He admits that he does not eat a terribly healthy diet. He admits that he lost 80 pounds after his heart attack but has gained it all back. He denies lower extremity edema, orthopnea or PND.   Past Medical History  Diagnosis Date  . Hypertension   . History of heart attack     posterior  . Hypercholesterolemia      History of hypercholesterolemia  . Diabetes mellitus, type 2 ([place])   . Obesity   . Coronary artery disease April 2010    3.5- x 15-mm XIENCE DES LAD 95>0/ chronic total occlusion of the dCFX, high-grade stenosis in the OM2, and mCFX, PTCA attempted w/ 1.5 mm balloon, minimal success    Past Surgical History  Procedure Laterality Date  . Cardiac catheterization  01/17/2009    3.5- x 15-mm XIENCE DES LAD 95>0/ CTO dCFX, high-grade stenosis in the OM2, and mCFX, PTCA attempted w/ 1.5 mm balloon, minimal success  . Knee surgery      Current Outpatient Prescriptions  Medication Sig Dispense Refill    . amLODipine (NORVASC) 10 MG tablet Take 1 tablet by mouth Daily.    Marland Kitchen aspirin EC 81 MG tablet Take 1 tablet (81 mg total) by mouth daily. 90 tablet 3  . benazepril (LOTENSIN) 40 MG tablet Take 1 tablet by mouth Daily.    . hydrochlorothiazide 25 MG tablet Take 25 mg by mouth daily.      Marland Kitchen labetalol (NORMODYNE) 200 MG tablet TAKE 1 TABLET TWICE DAILY. 60 tablet 0  . metFORMIN (GLUCOPHAGE) 500 MG tablet Take 500 mg by mouth 2 (two) times daily with a meal.    . NITROSTAT 0.4 MG SL tablet 1 TAB UNDER TONGUE AS NEEDED FOR CHEST PAIN. MAY REPEAT EVERY 5 MIN FOR A TOTAL OF 3 DOSES. 25 tablet 2  . simvastatin (ZOCOR) 10 MG tablet Take 10 mg by mouth at bedtime.       No current facility-administered medications for this visit.    Allergies:   Review of patient's allergies indicates no known allergies.    Social History:  The patient  reports that he quit smoking about 29 years ago. His smoking use included Cigarettes. He has never used smokeless tobacco. He reports that he does not drink alcohol or use illicit drugs.   Family History:  The patient's family  history includes Heart attack in his father; Hypertension in his mother.    ROS:  Please see the history of present illness. All other systems are reviewed and negative.    PHYSICAL EXAM: VS:  BP 152/90 mmHg  Pulse 60  Ht 5\' 9"  (1.753 m)  Wt 253 lb 8 oz (114.987 kg)  BMI 37.42 kg/m2 , BMI Body mass index is 37.42 kg/(m^2). GEN: Well nourished, well developed, male in no acute distress HEENT: normal for age  Neck: no JVD, no carotid bruit, no masses Cardiac: RRR; no murmur, no rubs, or gallops Respiratory:  clear to auscultation bilaterally, normal work of breathing GI: soft, nontender, nondistended, + BS MS: no deformity or atrophy; no edema; distal pulses are 2+ in all 4 extremities  Skin: warm and dry, no rash Neuro:  Strength and sensation are intact Psych: euthymic mood, full affect   EKG:  EKG is ordered today. The ekg  ordered today demonstrates sinus rhythm/sinus bradycardia, heart rate 58 No acute ischemic changes   Recent Labs: No results found for requested labs within last 365 days.    Lipid Panel No results found for: CHOL, TRIG, HDL, CHOLHDL, VLDL, LDLCALC, LDLDIRECT   Wt Readings from Last 3 Encounters:  10/01/15 253 lb 8 oz (114.987 kg)  07/25/14 226 lb (102.513 kg)  07/10/14 226 lb 6.4 oz (102.694 kg)     Other studies Reviewed: Additional studies/ records that were reviewed today include: Previous office visits stress testing and other records.  ASSESSMENT AND PLAN:  1.  History of CAD: The patient has no limitations on his activity. He is encouraged to have a heart healthy lifestyle and modify his eating 2 reduce his weight. His stress test from 2015 was reviewed and showed scar but no ischemia.   With no ischemia on the stress test, no ischemic symptoms and an EF of 52%, he has no limitations on his activity. A copy of the stress test was given to the patient.  Cardiac risk factor reduction is encouraged. Jon. Orland Mustard follows his lipids.   Current medicines are reviewed at length with the patient today.  The patient does not have concerns regarding medicines.  The following changes have been made:  no change  Labs/ tests ordered today include:   Orders Placed This Encounter  Procedures  . EKG 12-Lead     Disposition:   FU with Jon. Martinique  Signed, Jon Huang  10/01/2015 5:14 PM    [facility] [place], [place], [place]  [address] Phone: [phone]; Fax: [phone]

## 2015-10-01 NOTE — Patient Instructions (Signed)
Please keep your previously scheduled appointment with Dr Martinique.

## 2015-10-09 DIAGNOSIS — M47812 Spondylosis without myelopathy or radiculopathy, cervical region: Secondary | ICD-10-CM | POA: Diagnosis not present

## 2015-10-09 DIAGNOSIS — M542 Cervicalgia: Secondary | ICD-10-CM | POA: Diagnosis not present

## 2015-10-16 DIAGNOSIS — I10 Essential (primary) hypertension: Secondary | ICD-10-CM | POA: Diagnosis not present

## 2015-10-16 DIAGNOSIS — E119 Type 2 diabetes mellitus without complications: Secondary | ICD-10-CM | POA: Diagnosis not present

## 2015-10-16 DIAGNOSIS — Z7984 Long term (current) use of oral hypoglycemic drugs: Secondary | ICD-10-CM | POA: Diagnosis not present

## 2015-10-16 DIAGNOSIS — M542 Cervicalgia: Secondary | ICD-10-CM | POA: Diagnosis not present

## 2015-10-16 DIAGNOSIS — E7800 Pure hypercholesterolemia, unspecified: Secondary | ICD-10-CM | POA: Diagnosis not present

## 2015-10-17 DIAGNOSIS — M542 Cervicalgia: Secondary | ICD-10-CM | POA: Diagnosis not present

## 2015-10-25 DIAGNOSIS — M542 Cervicalgia: Secondary | ICD-10-CM | POA: Diagnosis not present

## 2015-10-28 DIAGNOSIS — M542 Cervicalgia: Secondary | ICD-10-CM | POA: Diagnosis not present

## 2015-10-30 DIAGNOSIS — M542 Cervicalgia: Secondary | ICD-10-CM | POA: Diagnosis not present

## 2015-11-04 DIAGNOSIS — M542 Cervicalgia: Secondary | ICD-10-CM | POA: Diagnosis not present

## 2015-11-07 DIAGNOSIS — M542 Cervicalgia: Secondary | ICD-10-CM | POA: Diagnosis not present

## 2015-11-11 DIAGNOSIS — M542 Cervicalgia: Secondary | ICD-10-CM | POA: Diagnosis not present

## 2015-11-14 DIAGNOSIS — M542 Cervicalgia: Secondary | ICD-10-CM | POA: Diagnosis not present

## 2015-11-16 DIAGNOSIS — M542 Cervicalgia: Secondary | ICD-10-CM | POA: Diagnosis not present

## 2015-11-16 DIAGNOSIS — M47812 Spondylosis without myelopathy or radiculopathy, cervical region: Secondary | ICD-10-CM | POA: Diagnosis not present

## 2015-11-25 ENCOUNTER — Encounter: Payer: Self-pay | Admitting: Cardiology

## 2015-11-25 ENCOUNTER — Ambulatory Visit (INDEPENDENT_AMBULATORY_CARE_PROVIDER_SITE_OTHER): Payer: Medicare Other | Admitting: Cardiology

## 2015-11-25 VITALS — BP 142/74 | HR 70 | Ht 67.5 in | Wt 246.1 lb

## 2015-11-25 DIAGNOSIS — E669 Obesity, unspecified: Secondary | ICD-10-CM

## 2015-11-25 DIAGNOSIS — I10 Essential (primary) hypertension: Secondary | ICD-10-CM | POA: Diagnosis not present

## 2015-11-25 DIAGNOSIS — I2510 Atherosclerotic heart disease of native coronary artery without angina pectoris: Secondary | ICD-10-CM

## 2015-11-25 DIAGNOSIS — E7800 Pure hypercholesterolemia, unspecified: Secondary | ICD-10-CM | POA: Diagnosis not present

## 2015-11-25 NOTE — Progress Notes (Signed)
Jon Huang Date of Birth: December 11, 1945   History of Present Illness: Jon Huang is seen for followup today. He has a history of CAD. He is s/p posterior MI in 1997 with occlusion of the LCx at that time. He was treated medically. In 2010 he had an abnormal stress test and cardiac cath demonstrated a severe proximal LAD stenosis. The LCx occlusion was collateralized. He had stenting of the LAD with a 3.5 x 15 mm Xience DES. We attempted to open the LCx. Despite crossing with a wire the lesion could not be dilated beyond a 2.0 mm balloon and the procedure was aborted.   His last ischemic evaluation with a stress Myoview study in October 2015 showed an inferior infarct with no ischemia and ejection fraction of 52%. 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  On follow up today he is concerned about neck pain he is having. 4 weeks ago he had a massage with deep tissue massage of his neck where his head was actually lifting off the table. The next day he had a lot of pain in his neck and it was very stiff. Seen by Ortho and given steroids and started PT. This is better. He did note that sometimes pain would radiate into his chest or head and he would get left ocular pain when he bent over. He wanted to make sure this was not vascular related.   Current Outpatient Prescriptions on File Prior to Visit  Medication Sig Dispense Refill  . amLODipine (NORVASC) 10 MG tablet Take 1 tablet by mouth Daily.    Marland Kitchen aspirin EC 81 MG tablet Take 1 tablet (81 mg total) by mouth daily. 90 tablet 3  . benazepril (LOTENSIN) 40 MG tablet Take 1 tablet by mouth Daily.    . hydrochlorothiazide 25 MG tablet Take 25 mg by mouth daily.      Marland Kitchen labetalol (NORMODYNE) 200 MG tablet TAKE 1 TABLET TWICE DAILY. 60 tablet 0  . metFORMIN (GLUCOPHAGE) 500 MG tablet Take 500 mg by mouth 2 (two) times daily with a meal.    . NITROSTAT 0.4 MG SL tablet 1 TAB UNDER TONGUE AS NEEDED FOR CHEST  PAIN. MAY REPEAT EVERY 5 MIN FOR A TOTAL OF 3 DOSES. 25 tablet 2  . simvastatin (ZOCOR) 10 MG tablet Take 10 mg by mouth at bedtime.       No current facility-administered medications on file prior to visit.    No Known Allergies  Past Medical History  Diagnosis Date  . Hypertension   . History of heart attack     posterior  . Hypercholesterolemia      History of hypercholesterolemia  . Diabetes mellitus, type 2 ([place])   . Obesity   . Coronary artery disease April 2010    3.5- x 15-mm XIENCE DES LAD 95>0/ chronic total occlusion of the dCFX, high-grade stenosis in the OM2, and mCFX, PTCA attempted w/ 1.5 mm balloon, minimal success  . OSA (obstructive sleep apnea)     Past Surgical History  Procedure Laterality Date  . Cardiac catheterization  01/17/2009    3.5- x 15-mm XIENCE DES LAD 95>0/ CTO dCFX, high-grade stenosis in the OM2, and mCFX, PTCA attempted w/ 1.5 mm balloon, minimal success  . Knee surgery      History  Smoking status  . Former Smoker  . Types: Cigarettes  . Quit date: 02/03/1986  Smokeless tobacco  .  Never Used    History  Alcohol Use No    Family History  Problem Relation Age of Onset  . Stroke      strong family history of   . Heart disease      strong family history of  . Hypertension      strong family history of   . Hypertension Mother   . Heart attack Father     Review of Systems:  As noted in history of present illness. All other systems were reviewed and are negative.  Physical Exam: BP 142/74 mmHg  Pulse 70  Ht 5' 7.5" (1.715 m)  Wt 111.642 kg (246 lb 2 oz)  BMI 37.96 kg/m2 He is a pleasant obese white male in no distress. HEENT exam is unremarkable. He has no JVD or bruits. carotid pulses are good. Lungs are clear. Cardiac exam reveals a regular rate and rhythm without gallop, murmur, or click. Abdomen is obese, soft, nontender. He has no significant edema. He does have some superficial varicosities in his right lower extremity  with some hyperpigmentation. Pedal pulses are good. Neurologic exam is nonfocal.  LABORATORY DATA:   Assessment / Plan: 1.CAD. S/p DES of proximal LAD in 2010. Chronic occlusion of mid LCx. Asymptomatic. Myoview in October 2015 showed fixed inferior infarct without ischemia. EF 52%.  Continue ASA 81 mg. Continue beta blocker and amlodipine.  2. HTN under good control. Continue Normodyne, amlodipine, HCTZ, and benazepril.   3. DM type 2 on metformin.  4. Obesity. Possible sleep apnea. For sleep study.  5. Hyperlipidemia. On Zocor.  6. RBBB  7. Neck pain- this is clearly musculoskeletal. Reassured patient. No further vascular evaluation needed.

## 2015-11-25 NOTE — Patient Instructions (Addendum)
Continue your current therapy  I will see you in one year  Work on increasing your activity and losing weight

## 2015-12-24 DIAGNOSIS — G4733 Obstructive sleep apnea (adult) (pediatric): Secondary | ICD-10-CM | POA: Diagnosis not present

## 2016-01-14 DIAGNOSIS — E119 Type 2 diabetes mellitus without complications: Secondary | ICD-10-CM | POA: Diagnosis not present

## 2016-01-14 DIAGNOSIS — H35033 Hypertensive retinopathy, bilateral: Secondary | ICD-10-CM | POA: Diagnosis not present

## 2016-01-17 DIAGNOSIS — M25561 Pain in right knee: Secondary | ICD-10-CM | POA: Diagnosis not present

## 2016-01-29 DIAGNOSIS — E785 Hyperlipidemia, unspecified: Secondary | ICD-10-CM | POA: Diagnosis not present

## 2016-01-29 DIAGNOSIS — E119 Type 2 diabetes mellitus without complications: Secondary | ICD-10-CM | POA: Diagnosis not present

## 2016-01-29 DIAGNOSIS — Z125 Encounter for screening for malignant neoplasm of prostate: Secondary | ICD-10-CM | POA: Diagnosis not present

## 2016-01-29 DIAGNOSIS — I10 Essential (primary) hypertension: Secondary | ICD-10-CM | POA: Diagnosis not present

## 2016-01-29 DIAGNOSIS — Z0000 Encounter for general adult medical examination without abnormal findings: Secondary | ICD-10-CM | POA: Diagnosis not present

## 2016-02-06 DIAGNOSIS — Z125 Encounter for screening for malignant neoplasm of prostate: Secondary | ICD-10-CM | POA: Diagnosis not present

## 2016-02-06 DIAGNOSIS — Z0000 Encounter for general adult medical examination without abnormal findings: Secondary | ICD-10-CM | POA: Diagnosis not present

## 2016-02-06 DIAGNOSIS — E785 Hyperlipidemia, unspecified: Secondary | ICD-10-CM | POA: Diagnosis not present

## 2016-02-06 DIAGNOSIS — E119 Type 2 diabetes mellitus without complications: Secondary | ICD-10-CM | POA: Diagnosis not present

## 2016-03-26 DIAGNOSIS — M6208 Separation of muscle (nontraumatic), other site: Secondary | ICD-10-CM | POA: Diagnosis not present

## 2016-03-26 DIAGNOSIS — R002 Palpitations: Secondary | ICD-10-CM | POA: Diagnosis not present

## 2016-04-10 DIAGNOSIS — L821 Other seborrheic keratosis: Secondary | ICD-10-CM | POA: Diagnosis not present

## 2016-04-10 DIAGNOSIS — D692 Other nonthrombocytopenic purpura: Secondary | ICD-10-CM | POA: Diagnosis not present

## 2016-04-10 DIAGNOSIS — D225 Melanocytic nevi of trunk: Secondary | ICD-10-CM | POA: Diagnosis not present

## 2016-04-10 DIAGNOSIS — L570 Actinic keratosis: Secondary | ICD-10-CM | POA: Diagnosis not present

## 2016-04-10 DIAGNOSIS — L812 Freckles: Secondary | ICD-10-CM | POA: Diagnosis not present

## 2016-05-13 DIAGNOSIS — E119 Type 2 diabetes mellitus without complications: Secondary | ICD-10-CM | POA: Diagnosis not present

## 2016-05-13 DIAGNOSIS — Z7984 Long term (current) use of oral hypoglycemic drugs: Secondary | ICD-10-CM | POA: Diagnosis not present

## 2016-05-13 DIAGNOSIS — E785 Hyperlipidemia, unspecified: Secondary | ICD-10-CM | POA: Diagnosis not present

## 2016-05-13 DIAGNOSIS — I10 Essential (primary) hypertension: Secondary | ICD-10-CM | POA: Diagnosis not present

## 2016-05-13 DIAGNOSIS — Z6834 Body mass index (BMI) 34.0-34.9, adult: Secondary | ICD-10-CM | POA: Diagnosis not present

## 2016-09-02 ENCOUNTER — Other Ambulatory Visit: Payer: Self-pay | Admitting: Cardiology

## 2016-09-14 DIAGNOSIS — I10 Essential (primary) hypertension: Secondary | ICD-10-CM | POA: Diagnosis not present

## 2016-09-14 DIAGNOSIS — Z7984 Long term (current) use of oral hypoglycemic drugs: Secondary | ICD-10-CM | POA: Diagnosis not present

## 2016-09-14 DIAGNOSIS — M545 Low back pain: Secondary | ICD-10-CM | POA: Diagnosis not present

## 2016-09-14 DIAGNOSIS — R238 Other skin changes: Secondary | ICD-10-CM | POA: Diagnosis not present

## 2016-09-14 DIAGNOSIS — Z6834 Body mass index (BMI) 34.0-34.9, adult: Secondary | ICD-10-CM | POA: Diagnosis not present

## 2016-09-14 DIAGNOSIS — E785 Hyperlipidemia, unspecified: Secondary | ICD-10-CM | POA: Diagnosis not present

## 2016-09-14 DIAGNOSIS — E119 Type 2 diabetes mellitus without complications: Secondary | ICD-10-CM | POA: Diagnosis not present

## 2016-09-14 DIAGNOSIS — E1165 Type 2 diabetes mellitus with hyperglycemia: Secondary | ICD-10-CM | POA: Diagnosis not present

## 2016-11-29 NOTE — Progress Notes (Signed)
Livia Snellen Jeune Date of Birth: 1945/12/08   History of Present Illness: Ed is seen for followup today. He has a history of CAD. He is s/p posterior MI in 1997 with occlusion of the LCx at that time. He was treated medically. In 2010 he had an abnormal stress test and cardiac cath demonstrated a severe proximal LAD stenosis. The LCx occlusion was collateralized. He had stenting of the LAD with a 3.5 x 15 mm Xience DES. We attempted to open the LCx. Despite crossing with a wire the lesion could not be dilated beyond a 2.0 mm balloon and the procedure was aborted.   His last ischemic evaluation with a stress Myoview study in October 2015 showed an inferior infarct with no ischemia and ejection fraction of 52%.   On follow up today he is doing very well. He is now retired but working part time. He is doing weight watchers and has lost 27 lbs. Goal is 175 lbs. No chest pain, palpitations, dizziness, or SOB. Feels much better with weight loss.   Current Outpatient Prescriptions on File Prior to Visit  Medication Sig Dispense Refill  . aspirin EC 81 MG tablet Take 1 tablet (81 mg total) by mouth daily. 90 tablet 3  . benazepril (LOTENSIN) 40 MG tablet Take 1 tablet by mouth Daily.    . hydrochlorothiazide 25 MG tablet Take 25 mg by mouth daily.      Marland Kitchen labetalol (NORMODYNE) 200 MG tablet TAKE 1 TABLET TWICE DAILY. 60 tablet 0  . metFORMIN (GLUCOPHAGE) 500 MG tablet Take 500 mg by mouth 2 (two) times daily with a meal.    . NITROSTAT 0.4 MG SL tablet 1 TAB UNDER TONGUE AS NEEDED FOR CHEST PAIN. MAY REPEAT EVERY 5 MIN FOR A TOTAL OF 3 DOSES. 25 tablet 3  . simvastatin (ZOCOR) 10 MG tablet Take 10 mg by mouth at bedtime.       No current facility-administered medications on file prior to visit.     No Known Allergies  Past Medical History:  Diagnosis Date  . Coronary artery disease April 2010   3.5- x 15-mm XIENCE DES LAD 95>0/ chronic total occlusion of the dCFX, high-grade stenosis in the OM2,  and mCFX, PTCA attempted w/ 1.5 mm balloon, minimal success  . Diabetes mellitus, type 2 ([place])   . History of heart attack    posterior  . Hypercholesterolemia     History of hypercholesterolemia  . Hypertension   . Obesity   . OSA (obstructive sleep apnea)     Past Surgical History:  Procedure Laterality Date  . CARDIAC CATHETERIZATION  01/17/2009   3.5- x 15-mm XIENCE DES LAD 95>0/ CTO dCFX, high-grade stenosis in the OM2, and mCFX, PTCA attempted w/ 1.5 mm balloon, minimal success  . KNEE SURGERY      History  Smoking Status  . Former Smoker  . Types: Cigarettes  . Quit date: 02/03/1986  Smokeless Tobacco  . Never Used    History  Alcohol Use No    Family History  Problem Relation Age of Onset  . Stroke      strong family history of   . Heart disease      strong family history of  . Hypertension      strong family history of   . Hypertension Mother   . Heart attack Father     Review of Systems:  As noted in history of present illness. All other systems were reviewed and are  negative.  Physical Exam: BP 110/70   Pulse 69   Ht 5\' 7"  (1.702 m)   Wt 219 lb (99.3 kg)   BMI 34.30 kg/m  He is a pleasant obese white male in no distress. HEENT exam is unremarkable. He has no JVD or bruits. carotid pulses are good. Lungs are clear. Cardiac exam reveals a regular rate and rhythm without gallop, murmur, or click. Abdomen is obese, soft, nontender. He has no significant edema. He does have some superficial varicosities in his right lower extremity with some hyperpigmentation. Pedal pulses are good. Neurologic exam is nonfocal.  LABORATORY DATA: Dated 02/06/16: cholesterol 126, triglycerides 79, HDL 50, LDL 60.  Dated 09/14/16: A1c , creatinine 1.1. A1c 5.9%. Other chemistries normal.   Ecg today shows NSR with PVCs. Rate 69. RBBB. I have personally reviewed and interpreted this study.  Assessment / Plan: 1.CAD. S/p DES of proximal LAD in 2010. Chronic occlusion of mid  LCx. Asymptomatic. Myoview in October 2015 showed fixed inferior infarct without ischemia. EF 52%.  Continue ASA 81 mg. Continue beta blocker and amlodipine.  2. HTN under excellent control. Continue Normodyne, amlodipine, HCTZ, and benazepril. Will reduce amlodipine to 5 mg daily. May be able to reduce BP meds further if he continues weight loss.  3. DM type 2 on metformin.  4. Obesity.  5. Hyperlipidemia. On Zocor. Well controlled.   6. RBBB- intermittent and asymptomatic.  7. PVCs asymptomatic continue beta blocker.

## 2016-12-01 ENCOUNTER — Ambulatory Visit (INDEPENDENT_AMBULATORY_CARE_PROVIDER_SITE_OTHER): Payer: Medicare Other | Admitting: Cardiology

## 2016-12-01 ENCOUNTER — Encounter: Payer: Self-pay | Admitting: Cardiology

## 2016-12-01 VITALS — BP 110/70 | HR 69 | Ht 67.0 in | Wt 219.0 lb

## 2016-12-01 DIAGNOSIS — E119 Type 2 diabetes mellitus without complications: Secondary | ICD-10-CM | POA: Diagnosis not present

## 2016-12-01 DIAGNOSIS — I10 Essential (primary) hypertension: Secondary | ICD-10-CM | POA: Diagnosis not present

## 2016-12-01 DIAGNOSIS — E7800 Pure hypercholesterolemia, unspecified: Secondary | ICD-10-CM

## 2016-12-01 DIAGNOSIS — I2510 Atherosclerotic heart disease of native coronary artery without angina pectoris: Secondary | ICD-10-CM

## 2016-12-01 MED ORDER — AMLODIPINE BESYLATE 5 MG PO TABS: 5.0000 mg | ORAL_TABLET | Freq: Every day | ORAL | 3 refills | Status: DC

## 2016-12-01 NOTE — Patient Instructions (Addendum)
Reduce amlodipine to 5 mg daily  Continue your other therapy  I will see you in one year

## 2016-12-01 NOTE — Addendum Note (Signed)
Addended by: Kathyrn Lass on: 12/01/2016 03:38 PM   Modules accepted: Orders

## 2016-12-02 DIAGNOSIS — Z7984 Long term (current) use of oral hypoglycemic drugs: Secondary | ICD-10-CM | POA: Diagnosis not present

## 2016-12-02 DIAGNOSIS — E119 Type 2 diabetes mellitus without complications: Secondary | ICD-10-CM | POA: Diagnosis not present

## 2016-12-28 DIAGNOSIS — G4733 Obstructive sleep apnea (adult) (pediatric): Secondary | ICD-10-CM | POA: Diagnosis not present

## 2017-02-01 DIAGNOSIS — Z136 Encounter for screening for cardiovascular disorders: Secondary | ICD-10-CM | POA: Diagnosis not present

## 2017-02-01 DIAGNOSIS — Z0000 Encounter for general adult medical examination without abnormal findings: Secondary | ICD-10-CM | POA: Diagnosis not present

## 2017-02-01 DIAGNOSIS — I10 Essential (primary) hypertension: Secondary | ICD-10-CM | POA: Diagnosis not present

## 2017-02-01 DIAGNOSIS — E785 Hyperlipidemia, unspecified: Secondary | ICD-10-CM | POA: Diagnosis not present

## 2017-02-01 DIAGNOSIS — E119 Type 2 diabetes mellitus without complications: Secondary | ICD-10-CM | POA: Diagnosis not present

## 2017-02-01 DIAGNOSIS — Z125 Encounter for screening for malignant neoplasm of prostate: Secondary | ICD-10-CM | POA: Diagnosis not present

## 2017-02-04 ENCOUNTER — Other Ambulatory Visit: Payer: Self-pay | Admitting: Family Medicine

## 2017-02-04 DIAGNOSIS — Z136 Encounter for screening for cardiovascular disorders: Secondary | ICD-10-CM

## 2017-02-08 DIAGNOSIS — E119 Type 2 diabetes mellitus without complications: Secondary | ICD-10-CM | POA: Diagnosis not present

## 2017-02-08 DIAGNOSIS — E785 Hyperlipidemia, unspecified: Secondary | ICD-10-CM | POA: Diagnosis not present

## 2017-02-08 DIAGNOSIS — Z0000 Encounter for general adult medical examination without abnormal findings: Secondary | ICD-10-CM | POA: Diagnosis not present

## 2017-02-08 DIAGNOSIS — Z125 Encounter for screening for malignant neoplasm of prostate: Secondary | ICD-10-CM | POA: Diagnosis not present

## 2017-02-08 DIAGNOSIS — Z7984 Long term (current) use of oral hypoglycemic drugs: Secondary | ICD-10-CM | POA: Diagnosis not present

## 2017-02-23 ENCOUNTER — Ambulatory Visit
Admission: RE | Admit: 2017-02-23 | Discharge: 2017-02-23 | Disposition: A | Discharge status: Home / Self Care | Payer: Medicare Other | Source: Ambulatory Visit | Attending: Family Medicine | Admitting: Family Medicine

## 2017-02-23 DIAGNOSIS — Z136 Encounter for screening for cardiovascular disorders: Secondary | ICD-10-CM | POA: Diagnosis not present

## 2017-02-23 DIAGNOSIS — Z87891 Personal history of nicotine dependence: Secondary | ICD-10-CM | POA: Diagnosis not present

## 2017-03-30 DIAGNOSIS — E119 Type 2 diabetes mellitus without complications: Secondary | ICD-10-CM | POA: Diagnosis not present

## 2017-04-13 DIAGNOSIS — D1801 Hemangioma of skin and subcutaneous tissue: Secondary | ICD-10-CM | POA: Diagnosis not present

## 2017-04-13 DIAGNOSIS — L723 Sebaceous cyst: Secondary | ICD-10-CM | POA: Diagnosis not present

## 2017-04-13 DIAGNOSIS — L821 Other seborrheic keratosis: Secondary | ICD-10-CM | POA: Diagnosis not present

## 2017-04-13 DIAGNOSIS — L812 Freckles: Secondary | ICD-10-CM | POA: Diagnosis not present

## 2017-04-13 DIAGNOSIS — L570 Actinic keratosis: Secondary | ICD-10-CM | POA: Diagnosis not present

## 2017-05-13 DIAGNOSIS — I10 Essential (primary) hypertension: Secondary | ICD-10-CM | POA: Diagnosis not present

## 2017-05-13 DIAGNOSIS — Z6834 Body mass index (BMI) 34.0-34.9, adult: Secondary | ICD-10-CM | POA: Diagnosis not present

## 2017-05-13 DIAGNOSIS — E785 Hyperlipidemia, unspecified: Secondary | ICD-10-CM | POA: Diagnosis not present

## 2017-05-13 DIAGNOSIS — E119 Type 2 diabetes mellitus without complications: Secondary | ICD-10-CM | POA: Diagnosis not present

## 2017-09-13 DIAGNOSIS — I10 Essential (primary) hypertension: Secondary | ICD-10-CM | POA: Diagnosis not present

## 2017-09-13 DIAGNOSIS — E785 Hyperlipidemia, unspecified: Secondary | ICD-10-CM | POA: Diagnosis not present

## 2017-09-13 DIAGNOSIS — Z6836 Body mass index (BMI) 36.0-36.9, adult: Secondary | ICD-10-CM | POA: Diagnosis not present

## 2017-09-13 DIAGNOSIS — E119 Type 2 diabetes mellitus without complications: Secondary | ICD-10-CM | POA: Diagnosis not present

## 2017-11-11 DIAGNOSIS — L738 Other specified follicular disorders: Secondary | ICD-10-CM | POA: Diagnosis not present

## 2017-11-11 DIAGNOSIS — D485 Neoplasm of uncertain behavior of skin: Secondary | ICD-10-CM | POA: Diagnosis not present

## 2017-11-11 DIAGNOSIS — L570 Actinic keratosis: Secondary | ICD-10-CM | POA: Diagnosis not present

## 2017-11-11 DIAGNOSIS — C4442 Squamous cell carcinoma of skin of scalp and neck: Secondary | ICD-10-CM | POA: Diagnosis not present

## 2017-11-30 NOTE — Progress Notes (Signed)
Jon Huang Date of Birth: 1946-06-10   History of Present Illness: [name] is seen for followup today. He has a history of CAD. He is s/p posterior MI in 1997 with occlusion of the LCx at that time. He was treated medically. In 2010 he had an abnormal stress test and cardiac cath demonstrated a severe proximal LAD stenosis. The LCx occlusion was collateralized. He had stenting of the LAD with a 3.5 x 15 mm Xience DES. We attempted to open the LCx. Despite crossing with a wire the lesion could not be dilated beyond a 2.0 mm balloon and the procedure was aborted.   His last ischemic evaluation with a stress Myoview study in October 2015 showed an inferior infarct with no ischemia and ejection fraction of 52%.   On follow up today he is doing very well. He is still on a Weight watchers program and has kept his weight down. He denies any chest pain or SOB. Exercises with treadmill and weights 2-3 x per week. In general he feels very well.   Current Outpatient Medications on File Prior to Visit  Medication Sig Dispense Refill  . amLODipine (NORVASC) 5 MG tablet Take 1 tablet (5 mg total) by mouth daily. 180 tablet 3  . aspirin EC 81 MG tablet Take 1 tablet (81 mg total) by mouth daily. 90 tablet 3  . benazepril (LOTENSIN) 40 MG tablet Take 1 tablet by mouth Daily.    . hydrochlorothiazide 25 MG tablet Take 25 mg by mouth daily.      Marland Kitchen labetalol (NORMODYNE) 200 MG tablet TAKE 1 TABLET TWICE DAILY. 60 tablet 0  . metFORMIN (GLUCOPHAGE) 500 MG tablet Take 500 mg by mouth 2 (two) times daily with a meal.    . NITROSTAT 0.4 MG SL tablet 1 TAB UNDER TONGUE AS NEEDED FOR CHEST PAIN. MAY REPEAT EVERY 5 MIN FOR A TOTAL OF 3 DOSES. 25 tablet 3  . simvastatin (ZOCOR) 10 MG tablet Take 10 mg by mouth at bedtime.       No current facility-administered medications on file prior to visit.     No Known Allergies  Past Medical History:  Diagnosis Date  . Coronary artery disease April 2010   3.5- x 15-mm  XIENCE DES LAD 95>0/ chronic total occlusion of the dCFX, high-grade stenosis in the OM2, and mCFX, PTCA attempted w/ 1.5 mm balloon, minimal success  . Diabetes mellitus, type 2 ([place])   . History of heart attack    posterior  . Hypercholesterolemia     History of hypercholesterolemia  . Hypertension   . Obesity   . OSA (obstructive sleep apnea)     Past Surgical History:  Procedure Laterality Date  . CARDIAC CATHETERIZATION  01/17/2009   3.5- x 15-mm XIENCE DES LAD 95>0/ CTO dCFX, high-grade stenosis in the OM2, and mCFX, PTCA attempted w/ 1.5 mm balloon, minimal success  . KNEE SURGERY      Social History   Tobacco Use  Smoking Status Former Smoker  . Types: Cigarettes  . Last attempt to quit: 02/03/1986  . Years since quitting: 31.8  Smokeless Tobacco Never Used    Social History   Substance and Sexual Activity  Alcohol Use No    Family History  Problem Relation Age of Onset  . Stroke Unknown        strong family history of   . Heart disease Unknown        strong family history of  . Hypertension  Unknown        strong family history of   . Hypertension Mother   . Heart attack Father     Review of Systems:  As noted in history of present illness. All other systems were reviewed and are negative.  Physical Exam: BP 124/72   Pulse 61   Ht 5\' 9"  (1.753 m)   Wt 223 lb 6.4 oz (101.3 kg)   BMI 32.99 kg/m  GENERAL:  Well appearing obese WM in NAD HEENT:  PERRL, EOMI, sclera are clear. Oropharynx is clear. NECK:  No jugular venous distention, carotid upstroke brisk and symmetric, no bruits, no thyromegaly or adenopathy LUNGS:  Clear to auscultation bilaterally CHEST:  Unremarkable HEART:  RRR,  PMI not displaced or sustained,S1 and S2 within normal limits, no S3, no S4: no clicks, no rubs, no murmurs ABD:  Soft, nontender. BS +, no masses or bruits. No hepatomegaly, no splenomegaly EXT:  2 + pulses throughout, no edema, no cyanosis no clubbing SKIN:  Warm and  dry.  No rashes NEURO:  Alert and oriented x 3. Cranial nerves II through XII intact. PSYCH:  Cognitively intact    LABORATORY DATA: Dated 02/06/16: cholesterol 126, triglycerides 79, HDL 50, LDL 60.  Dated 09/14/16: A1c , creatinine 1.1. A1c 5.9%. Other chemistries normal.  Dated 02/08/17: cholesterol 150, triglycerides 78, HDL 47, LDL 88.  Dated 09/13/17: A1c 6.4%. Creatinine 1.2. Other chemistries OK.   Ecg today shows NSR with old inferior MI.  RBBB. Rate 61.I have personally reviewed and interpreted this study.  Assessment / Plan: 1.CAD. S/p DES of proximal LAD in 2010. Chronic occlusion of mid LCx. Asymptomatic. Myoview in October 2015 showed fixed inferior infarct without ischemia. EF 52%.  Continue ASA 81 mg. Continue beta blocker and amlodipine.  2. HTN under excellent control.  3. DM type 2 on metformin. Last A1c 6.4%. If he should require additional therapy I would consider Jardiance given CV risk reduction. Will defer to primary care.   4. Obesity. Continue Weight watchers.   5. Hyperlipidemia. On Zocor.   6. RBBB- intermittent and asymptomatic.  7. PVCs asymptomatic continue beta blocker.  I will follow up in one year.

## 2017-12-02 ENCOUNTER — Encounter: Payer: Self-pay | Admitting: Cardiology

## 2017-12-02 ENCOUNTER — Ambulatory Visit (INDEPENDENT_AMBULATORY_CARE_PROVIDER_SITE_OTHER): Payer: Medicare Other | Admitting: Cardiology

## 2017-12-02 VITALS — BP 124/72 | HR 61 | Ht 69.0 in | Wt 223.4 lb

## 2017-12-02 DIAGNOSIS — I2510 Atherosclerotic heart disease of native coronary artery without angina pectoris: Secondary | ICD-10-CM | POA: Diagnosis not present

## 2017-12-02 DIAGNOSIS — E119 Type 2 diabetes mellitus without complications: Secondary | ICD-10-CM

## 2017-12-02 DIAGNOSIS — I10 Essential (primary) hypertension: Secondary | ICD-10-CM

## 2017-12-02 DIAGNOSIS — E7800 Pure hypercholesterolemia, unspecified: Secondary | ICD-10-CM

## 2017-12-02 NOTE — Patient Instructions (Signed)
Continue your current therapy  I will see you in one year   

## 2017-12-29 DIAGNOSIS — G4733 Obstructive sleep apnea (adult) (pediatric): Secondary | ICD-10-CM | POA: Diagnosis not present

## 2018-01-06 ENCOUNTER — Other Ambulatory Visit: Payer: Self-pay | Admitting: Cardiology

## 2018-02-08 DIAGNOSIS — Z0000 Encounter for general adult medical examination without abnormal findings: Secondary | ICD-10-CM | POA: Diagnosis not present

## 2018-02-08 DIAGNOSIS — I10 Essential (primary) hypertension: Secondary | ICD-10-CM | POA: Diagnosis not present

## 2018-02-08 DIAGNOSIS — E119 Type 2 diabetes mellitus without complications: Secondary | ICD-10-CM | POA: Diagnosis not present

## 2018-02-08 DIAGNOSIS — Z23 Encounter for immunization: Secondary | ICD-10-CM | POA: Diagnosis not present

## 2018-02-08 DIAGNOSIS — E785 Hyperlipidemia, unspecified: Secondary | ICD-10-CM | POA: Diagnosis not present

## 2018-02-08 DIAGNOSIS — Z125 Encounter for screening for malignant neoplasm of prostate: Secondary | ICD-10-CM | POA: Diagnosis not present

## 2018-02-09 DIAGNOSIS — Z0000 Encounter for general adult medical examination without abnormal findings: Secondary | ICD-10-CM | POA: Diagnosis not present

## 2018-02-09 DIAGNOSIS — E785 Hyperlipidemia, unspecified: Secondary | ICD-10-CM | POA: Diagnosis not present

## 2018-02-09 DIAGNOSIS — Z125 Encounter for screening for malignant neoplasm of prostate: Secondary | ICD-10-CM | POA: Diagnosis not present

## 2018-02-09 DIAGNOSIS — E119 Type 2 diabetes mellitus without complications: Secondary | ICD-10-CM | POA: Diagnosis not present

## 2018-04-12 DIAGNOSIS — D225 Melanocytic nevi of trunk: Secondary | ICD-10-CM | POA: Diagnosis not present

## 2018-04-12 DIAGNOSIS — L723 Sebaceous cyst: Secondary | ICD-10-CM | POA: Diagnosis not present

## 2018-04-12 DIAGNOSIS — Z85828 Personal history of other malignant neoplasm of skin: Secondary | ICD-10-CM | POA: Diagnosis not present

## 2018-04-12 DIAGNOSIS — L820 Inflamed seborrheic keratosis: Secondary | ICD-10-CM | POA: Diagnosis not present

## 2018-04-12 DIAGNOSIS — L821 Other seborrheic keratosis: Secondary | ICD-10-CM | POA: Diagnosis not present

## 2018-05-19 DIAGNOSIS — M25561 Pain in right knee: Secondary | ICD-10-CM | POA: Diagnosis not present

## 2018-06-10 DIAGNOSIS — Z6835 Body mass index (BMI) 35.0-35.9, adult: Secondary | ICD-10-CM | POA: Diagnosis not present

## 2018-06-10 DIAGNOSIS — E785 Hyperlipidemia, unspecified: Secondary | ICD-10-CM | POA: Diagnosis not present

## 2018-06-10 DIAGNOSIS — Z23 Encounter for immunization: Secondary | ICD-10-CM | POA: Diagnosis not present

## 2018-06-10 DIAGNOSIS — E1169 Type 2 diabetes mellitus with other specified complication: Secondary | ICD-10-CM | POA: Diagnosis not present

## 2018-06-10 DIAGNOSIS — I10 Essential (primary) hypertension: Secondary | ICD-10-CM | POA: Diagnosis not present

## 2018-07-26 DIAGNOSIS — E119 Type 2 diabetes mellitus without complications: Secondary | ICD-10-CM | POA: Diagnosis not present

## 2018-09-01 DIAGNOSIS — D485 Neoplasm of uncertain behavior of skin: Secondary | ICD-10-CM | POA: Diagnosis not present

## 2018-09-01 DIAGNOSIS — L820 Inflamed seborrheic keratosis: Secondary | ICD-10-CM | POA: Diagnosis not present

## 2018-09-01 DIAGNOSIS — Z85828 Personal history of other malignant neoplasm of skin: Secondary | ICD-10-CM | POA: Diagnosis not present

## 2018-09-01 DIAGNOSIS — B078 Other viral warts: Secondary | ICD-10-CM | POA: Diagnosis not present

## 2018-09-01 DIAGNOSIS — L821 Other seborrheic keratosis: Secondary | ICD-10-CM | POA: Diagnosis not present

## 2018-09-01 DIAGNOSIS — L570 Actinic keratosis: Secondary | ICD-10-CM | POA: Diagnosis not present

## 2018-09-05 DIAGNOSIS — H938X1 Other specified disorders of right ear: Secondary | ICD-10-CM | POA: Diagnosis not present

## 2018-09-07 ENCOUNTER — Telehealth: Payer: Self-pay | Admitting: *Deleted

## 2018-09-07 NOTE — Telephone Encounter (Signed)
   Primary Cardiologist:Peter Martinique, MD  Chart reviewed as part of pre-operative protocol coverage. Because of Jon Huang's past medical history and time since last visit, he/she will require a follow-up visit in order to better assess preoperative cardiovascular risk.  Pre-op covering staff: - Please schedule appointment and call patient to inform them. - Please contact requesting surgeon's office via preferred method (i.e, phone, fax) to inform them of need for appointment prior to surgery. - Please route this note to the provider that will see the patient so that he/she can address surgical clearance at that visit.   If applicable, this message will also be routed to pharmacy pool and/or primary cardiologist for input on holding anticoagulant/antiplatelet agent as requested below so that this information is available at time of patient's appointment. >> I will route to Dr. Martinique  This note will be removed from the preop pool.  Richardson Dopp, PA-C  09/07/2018, 5:19 PM

## 2018-09-07 NOTE — Telephone Encounter (Signed)
   [facility] Pre-operative Risk Assessment    Request for surgical clearance:  1. What type of surgery is being performed? EXCISION OF RIGHT EAR CANAL LESION  2. When is this surgery scheduled? 09/23/18   3. What type of clearance is required (medical clearance vs. Pharmacy clearance to hold med vs. Both)? BOTH  4. Are there any medications that need to be held prior to surgery and how long? ASA  5. Practice name and name of physician performing surgery?  [facility]  DR Jenny Reichmann [facility]  6. What is your office phone number [phone]   7.   What is your office fax number [phone]  8.   Anesthesia type (None, local, MAC, general) ? GENERAL   Devra Dopp 09/07/2018, 8:43 AM  _________________________________________________________________   (provider comments below)

## 2018-09-08 NOTE — Telephone Encounter (Signed)
Agree- will need office visit to address  Angelissa Supan Martinique MD, [facility]

## 2018-09-12 NOTE — Telephone Encounter (Signed)
Spoke to patient advised he will need office visit with Dr.Jordan to clear for upcoming surgery.Appointment scheduled with Dr.Jordan 09/14/18 at 2:20 pm.

## 2018-09-14 ENCOUNTER — Ambulatory Visit: Payer: Medicare Other | Admitting: Cardiology

## 2018-09-14 NOTE — Progress Notes (Deleted)
Jon Huang Date of Birth: Dec 18, 1945   History of Present Illness: Jon Huang is seen for followup today. He has a history of CAD. He is s/p posterior MI in 1997 with occlusion of the LCx at that time. He was treated medically. In 2010 he had an abnormal stress test and cardiac cath demonstrated a severe proximal LAD stenosis. The LCx occlusion was collateralized. He had stenting of the LAD with a 3.5 x 15 mm Xience DES. We attempted to open the LCx. Despite crossing with a wire the lesion could not be dilated beyond a 2.0 mm balloon and the procedure was aborted.   His last ischemic evaluation with a stress Myoview study in October 2015 showed an inferior infarct with no ischemia and ejection fraction of 52%.   On follow up today he is doing very well. He is still on a Weight watchers program and has kept his weight down. He denies any chest pain or SOB. Exercises with treadmill and weights 2-3 x per week. In general he feels very well.   Current Outpatient Medications on File Prior to Visit  Medication Sig Dispense Refill  . amLODipine (NORVASC) 5 MG tablet TAKE 1 TABLET ONCE DAILY. 90 tablet 3  . aspirin EC 81 MG tablet Take 1 tablet (81 mg total) by mouth daily. 90 tablet 3  . benazepril (LOTENSIN) 40 MG tablet Take 1 tablet by mouth Daily.    . hydrochlorothiazide 25 MG tablet Take 25 mg by mouth daily.      Marland Kitchen labetalol (NORMODYNE) 200 MG tablet TAKE 1 TABLET TWICE DAILY. 60 tablet 0  . metFORMIN (GLUCOPHAGE) 500 MG tablet Take 500 mg by mouth 2 (two) times daily with a meal.    . NITROSTAT 0.4 MG SL tablet 1 TAB UNDER TONGUE AS NEEDED FOR CHEST PAIN. MAY REPEAT EVERY 5 MIN FOR A TOTAL OF 3 DOSES. 25 tablet 3  . simvastatin (ZOCOR) 10 MG tablet Take 10 mg by mouth at bedtime.       No current facility-administered medications on file prior to visit.     No Known Allergies  Past Medical History:  Diagnosis Date  . Coronary artery disease April 2010   3.5- x 15-mm XIENCE DES LAD 95>0/  chronic total occlusion of the dCFX, high-grade stenosis in the OM2, and mCFX, PTCA attempted w/ 1.5 mm balloon, minimal success  . Diabetes mellitus, type 2 ([place])   . History of heart attack    posterior  . Hypercholesterolemia     History of hypercholesterolemia  . Hypertension   . Obesity   . OSA (obstructive sleep apnea)     Past Surgical History:  Procedure Laterality Date  . CARDIAC CATHETERIZATION  01/17/2009   3.5- x 15-mm XIENCE DES LAD 95>0/ CTO dCFX, high-grade stenosis in the OM2, and mCFX, PTCA attempted w/ 1.5 mm balloon, minimal success  . KNEE SURGERY      Social History   Tobacco Use  Smoking Status Former Smoker  . Types: Cigarettes  . Last attempt to quit: 02/03/1986  . Years since quitting: 32.6  Smokeless Tobacco Never Used    Social History   Substance and Sexual Activity  Alcohol Use No    Family History  Problem Relation Age of Onset  . Stroke Unknown        strong family history of   . Heart disease Unknown        strong family history of  . Hypertension Unknown  strong family history of   . Hypertension Mother   . Heart attack Father     Review of Systems:  As noted in history of present illness. All other systems were reviewed and are negative.  Physical Exam: There were no vitals taken for this visit. GENERAL:  Well appearing obese WM in NAD HEENT:  PERRL, EOMI, sclera are clear. Oropharynx is clear. NECK:  No jugular venous distention, carotid upstroke brisk and symmetric, no bruits, no thyromegaly or adenopathy LUNGS:  Clear to auscultation bilaterally CHEST:  Unremarkable HEART:  RRR,  PMI not displaced or sustained,S1 and S2 within normal limits, no S3, no S4: no clicks, no rubs, no murmurs ABD:  Soft, nontender. BS +, no masses or bruits. No hepatomegaly, no splenomegaly EXT:  2 + pulses throughout, no edema, no cyanosis no clubbing SKIN:  Warm and dry.  No rashes NEURO:  Alert and oriented x 3. Cranial nerves II  through XII intact. PSYCH:  Cognitively intact    LABORATORY DATA: Dated 02/06/16: cholesterol 126, triglycerides 79, HDL 50, LDL 60.  Dated 09/14/16: A1c , creatinine 1.1. A1c 5.9%. Other chemistries normal.  Dated 02/08/17: cholesterol 150, triglycerides 78, HDL 47, LDL 88.  Dated 09/13/17: A1c 6.4%. Creatinine 1.2. Other chemistries OK.   Ecg today shows NSR with old inferior MI.  RBBB. Rate 61.I have personally reviewed and interpreted this study.  Assessment / Plan: 1.CAD. S/p DES of proximal LAD in 2010. Chronic occlusion of mid LCx. Asymptomatic. Myoview in October 2015 showed fixed inferior infarct without ischemia. EF 52%.  Continue ASA 81 mg. Continue beta blocker and amlodipine.  2. HTN under excellent control.  3. DM type 2 on metformin. Last A1c 6.4%. If he should require additional therapy I would consider Jardiance given CV risk reduction. Will defer to primary care.   4. Obesity. Continue Weight watchers.   5. Hyperlipidemia. On Zocor.   6. RBBB- intermittent and asymptomatic.  7. PVCs asymptomatic continue beta blocker.  I will follow up in one year.

## 2018-09-15 ENCOUNTER — Encounter: Payer: Self-pay | Admitting: Physician Assistant

## 2018-09-15 ENCOUNTER — Ambulatory Visit (INDEPENDENT_AMBULATORY_CARE_PROVIDER_SITE_OTHER): Payer: Medicare Other | Admitting: Physician Assistant

## 2018-09-15 VITALS — BP 140/84 | HR 65 | Ht 69.0 in | Wt 236.0 lb

## 2018-09-15 DIAGNOSIS — I10 Essential (primary) hypertension: Secondary | ICD-10-CM | POA: Diagnosis not present

## 2018-09-15 DIAGNOSIS — Z01810 Encounter for preprocedural cardiovascular examination: Secondary | ICD-10-CM

## 2018-09-15 DIAGNOSIS — I2510 Atherosclerotic heart disease of native coronary artery without angina pectoris: Secondary | ICD-10-CM

## 2018-09-15 DIAGNOSIS — E119 Type 2 diabetes mellitus without complications: Secondary | ICD-10-CM

## 2018-09-15 DIAGNOSIS — E785 Hyperlipidemia, unspecified: Secondary | ICD-10-CM

## 2018-09-15 NOTE — Patient Instructions (Signed)
Medication Instructions:  Continue same meds If you need a refill on your cardiac medications before your next appointment, please call your pharmacy.   Lab work: None ordered   Testing/Procedures: None ordered  Follow-Up: At [facility], you and your health needs are our priority.  As part of our continuing mission to provide you with exceptional heart care, we have created designated Provider Care Teams.  These Care Teams include your primary Cardiologist (physician) and Advanced Practice Providers (APPs -  Physician Assistants and Nurse Practitioners) who all work together to provide you with the care you need, when you need it. Marland Kitchen Keep appointment with Dr.Jordan Mon 12/05/18 at 4:00 pm .   You are cleared for upcoming surgery

## 2018-09-15 NOTE — Progress Notes (Signed)
Cardiology Office Note    Date:  09/18/2018   ID:  Jon, Huang 01/27/1946, MRN [id number]  PCP:  London Pepper, MD  Cardiologist:  Dr. Martinique   Chief Complaint  Patient presents with  . Pre-op Exam    seen for Dr. Martinique.     History of Present Illness:  Jon Huang is a 72 y.o. male with PMH of CAD s/p DES to LAD, RBBB, HLD, HTN, OSA and DM2.  He had a posterior MI in 1997 with occluded left circumflex.  He was treated medically at the time.  In 2010, he had a normal stress test and underwent cardiac catheterization demonstrating severe proximal LAD stenosis.  The left circumflex artery occlusion was collateralized distally.  He underwent 3.5 x 15 mm Xience DES placement to LAD.  Attempt was made to open up the left circumflex, however despite the ability to cross with a wire, however the lesion could not be dilated beyond 2.0 mm and the procedure was aborted.  He had a stress Myoview in October 2015 that showed inferior infarct with no ischemia and EF of 52%.  His last office visit with Dr. Martinique was in February 2019 at which time he was doing well.  Patient was recently seen by Dr. Janace Hoard of otolaryngology at [facility] for right ear canal mass.  It is planned for him to undergo excision of the right ear canal lesion on 09/23/2018.  According to the patient, he has been doing very well on the current set of medications since earlier this year.  He denies any recurrent chest discomfort or exertional shortness of breath.  The last time he played 18 holes of golf was a month ago.  During the Thanksgiving season, he climbs a lot of stairs at home without any exertional chest pain or shortness of breath.  He is able to climb at least 2 flights of stairs without exertional symptom and walk at least 2 blocks away from his house and back without any issue.  Since he is able to complete at least 4 METS of activity without any problem, he is cleared to proceed with the ENT surgery  without further work-up.  He will need to hold aspirin for 7 days prior to the procedure and restart aspirin as soon as possible after the procedure.  His primary care provider has been following on his cholesterol lab work.   Past Medical History:  Diagnosis Date  . Coronary artery disease April 2010   3.5- x 15-mm XIENCE DES LAD 95>0/ chronic total occlusion of the dCFX, high-grade stenosis in the OM2, and mCFX, PTCA attempted w/ 1.5 mm balloon, minimal success  . Diabetes mellitus, type 2 ([place])   . History of heart attack    posterior  . Hypercholesterolemia     History of hypercholesterolemia  . Hypertension   . Obesity   . OSA (obstructive sleep apnea)     Past Surgical History:  Procedure Laterality Date  . CARDIAC CATHETERIZATION  01/17/2009   3.5- x 15-mm XIENCE DES LAD 95>0/ CTO dCFX, high-grade stenosis in the OM2, and mCFX, PTCA attempted w/ 1.5 mm balloon, minimal success  . KNEE SURGERY      Current Medications: Outpatient Medications Prior to Visit  Medication Sig Dispense Refill  . amLODipine (NORVASC) 5 MG tablet TAKE 1 TABLET ONCE DAILY. 90 tablet 3  . aspirin EC 81 MG tablet Take 1 tablet (81 mg total) by mouth daily. 90 tablet 3  .  benazepril (LOTENSIN) 40 MG tablet Take 1 tablet by mouth Daily.    . hydrochlorothiazide 25 MG tablet Take 25 mg by mouth daily.      Marland Kitchen labetalol (NORMODYNE) 200 MG tablet TAKE 1 TABLET TWICE DAILY. 60 tablet 0  . metFORMIN (GLUCOPHAGE) 500 MG tablet Take 500 mg by mouth 2 (two) times daily with a meal.    . NITROSTAT 0.4 MG SL tablet 1 TAB UNDER TONGUE AS NEEDED FOR CHEST PAIN. MAY REPEAT EVERY 5 MIN FOR A TOTAL OF 3 DOSES. 25 tablet 3  . simvastatin (ZOCOR) 10 MG tablet Take 10 mg by mouth at bedtime.       No facility-administered medications prior to visit.      Allergies:   Patient has no known allergies.   Social History   Socioeconomic History  . Marital status: Married    Spouse name: Not on file  . Number of  children: 2  . Years of education: Not on file  . Highest education level: Not on file  Occupational History  . 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 Financial resource strain: Not on file  . Food insecurity:    Worry: Not on file    Inability: Not on file  . Transportation needs:    Medical: Not on file    Non-medical: Not on file  Tobacco Use  . Smoking status: Former Smoker    Types: Cigarettes    Last attempt to quit: 02/03/1986    Years since quitting: 32.6  . Smokeless tobacco: Never Used  Substance and Sexual Activity  . Alcohol use: No  . Drug use: No  . Sexual activity: Not on file  Lifestyle  . Physical activity:    Days per week: Not on file    Minutes per session: Not on file  . Stress: Not on file  Relationships  . Social connections:    Talks on phone: Not on file    Gets together: Not on file    Attends religious service: Not on file    Active member of club or organization: Not on file    Attends meetings of clubs or organizations: Not on file    Relationship status: Not on file  Other Topics Concern  . Not on file  Social History Narrative  . Not on file     Family History:  The patient's family history includes Heart attack in his father; Heart disease in his unknown relative; Hypertension in his mother and unknown relative; Stroke in his unknown relative.   ROS:   Please see the history of present illness.    ROS All other systems reviewed and are negative.   PHYSICAL EXAM:   VS:  BP 140/84   Pulse 65   Ht 5\' 9"  (1.753 m)   Wt 236 lb (107 kg)   BMI 34.85 kg/m    GEN: Well nourished, well developed, in no acute distress  HEENT: normal  Neck: no JVD, carotid bruits, or masses Cardiac: RRR; no murmurs, rubs, or gallops,no edema  Respiratory:  clear to auscultation bilaterally, normal work of breathing GI: soft, nontender, nondistended, + BS MS: no deformity or atrophy  Skin: warm and dry, no rash Neuro:   Alert and Oriented x 3, Strength and sensation are intact Psych: euthymic mood, full affect  Wt Readings from Last 3 Encounters:  09/15/18 236 lb (107 kg)  12/02/17 223 lb 6.4 oz (101.3 kg)  12/01/16  219 lb (99.3 kg)      Studies/Labs Reviewed:   EKG:  EKG is ordered today.  The ekg ordered today demonstrates NSR with RBBB  Recent Labs: No results found for requested labs within last 8760 hours.   Lipid Panel No results found for: CHOL, TRIG, HDL, CHOLHDL, VLDL, LDLCALC, LDLDIRECT  Additional studies/ records that were reviewed today include:   Myoview 07/25/2014 Impression Exercise Capacity:  Good exercise capacity. BP Response:  Normal blood pressure response. Clinical Symptoms:  No significant symptoms noted. ECG Impression:  Significant ST abnormalities consistent with ischemia. Comparison with Prior Nuclear Study: direct image comparison to 2010 and 2012 shows identical perfusion pattern   Overall Impression:  Low risk stress nuclear study with a large inferior wall scar with mild peri-infarct inferolateral ischemia.    ASSESSMENT:    1. Preop cardiovascular exam   2. Coronary artery disease involving native coronary artery of native heart without angina pectoris   3. Hypertension, essential   4. Hyperlipidemia, unspecified hyperlipidemia type   5. Controlled type 2 diabetes mellitus without complication, without long-term current use of insulin (HCC)      PLAN:  In order of problems listed above:  1. Preoperative clearance: Patient is planning for right ear canal mass removal by Dr. Janace Hoard, this is a low risk procedure.  His last PCI was over 55-month ago, he may hold aspirin for 7 days prior to proceeding with surgery. No further work-up is needed  2. CAD: Last PCI was in 2010, [place] in 2015 was low risk.  Currently on aspirin which will need to be held prior to surgery  3. Hypertension: Blood pressure stable on current therapy  4. Hyperlipidemia:  Continue Zocor 10 mg daily  5. DM2: On metformin, monitored by primary care provider.    Medication Adjustments/Labs and Tests Ordered: Current medicines are reviewed at length with the patient today.  Concerns regarding medicines are outlined above.  Medication changes, Labs and Tests ordered today are listed in the Patient Instructions below. Patient Instructions  Medication Instructions:  Continue same meds If you need a refill on your cardiac medications before your next appointment, please call your pharmacy.   Lab work: None ordered   Testing/Procedures: None ordered  Follow-Up: At [facility], you and your health needs are our priority.  As part of our continuing mission to provide you with exceptional heart care, we have created designated Provider Care Teams.  These Care Teams include your primary Cardiologist (physician) and Advanced Practice Providers (APPs -  Physician Assistants and Nurse Practitioners) who all work together to provide you with the care you need, when you need it. Marland Kitchen Keep appointment with Dr.Jordan Mon 12/05/18 at 4:00 pm .   You are cleared for upcoming surgery      Signed, Almyra Deforest, PA  09/18/2018 12:01 AM    [address], [place], [place]  [address] Phone: [phone]; Fax: [phone]

## 2018-09-17 ENCOUNTER — Encounter: Payer: Self-pay | Admitting: Physician Assistant

## 2018-09-23 DIAGNOSIS — H938X1 Other specified disorders of right ear: Secondary | ICD-10-CM | POA: Diagnosis not present

## 2018-09-23 DIAGNOSIS — B079 Viral wart, unspecified: Secondary | ICD-10-CM | POA: Diagnosis not present

## 2018-09-23 DIAGNOSIS — B078 Other viral warts: Secondary | ICD-10-CM | POA: Diagnosis not present

## 2018-10-10 DIAGNOSIS — E785 Hyperlipidemia, unspecified: Secondary | ICD-10-CM | POA: Diagnosis not present

## 2018-10-10 DIAGNOSIS — E1169 Type 2 diabetes mellitus with other specified complication: Secondary | ICD-10-CM | POA: Diagnosis not present

## 2018-10-10 DIAGNOSIS — I10 Essential (primary) hypertension: Secondary | ICD-10-CM | POA: Diagnosis not present

## 2018-11-10 ENCOUNTER — Encounter: Payer: Self-pay | Admitting: Cardiology

## 2018-11-30 DIAGNOSIS — Z8669 Personal history of other diseases of the nervous system and sense organs: Secondary | ICD-10-CM | POA: Diagnosis not present

## 2018-11-30 DIAGNOSIS — L299 Pruritus, unspecified: Secondary | ICD-10-CM | POA: Diagnosis not present

## 2018-11-30 DIAGNOSIS — Z87891 Personal history of nicotine dependence: Secondary | ICD-10-CM | POA: Diagnosis not present

## 2018-12-04 NOTE — Progress Notes (Signed)
Jon Huang Date of Birth: Jan 13, 1946   History of Present Illness: Jon Huang is seen for followup today. He has a history of CAD. He is s/p posterior MI in 1997 with occlusion of the LCx at that time. He was treated medically. In 2010 he had an abnormal stress test and cardiac cath demonstrated a severe proximal LAD stenosis. The LCx occlusion was collateralized. He had stenting of the LAD with a 3.5 x 15 mm Xience DES. We attempted to open the LCx. Despite crossing with a wire the lesion could not be dilated beyond a 2.0 mm balloon and the procedure was aborted.   His last ischemic evaluation with a stress Myoview study in October 2015 showed an inferior infarct with no ischemia and ejection fraction of 52%.   He was seen by Almyra Deforest PA-C in December 2019 for pre op clearance for excision of a ear canal mass. This was a benign verrucous vulgaris.   On follow up today he is doing very well. He is no longer on Weight watchers program and has gained 15 lbs this year. He denies any chest pain or SOB. Very infrequent pressure. He has a bad knee and may need a knee replacement. He is still able to do some walking.   Current Outpatient Medications on File Prior to Visit  Medication Sig Dispense Refill  . amLODipine (NORVASC) 5 MG tablet TAKE 1 TABLET ONCE DAILY. 90 tablet 3  . aspirin EC 81 MG tablet Take 1 tablet (81 mg total) by mouth daily. 90 tablet 3  . benazepril (LOTENSIN) 40 MG tablet Take 1 tablet by mouth Daily.    . hydrochlorothiazide 25 MG tablet Take 25 mg by mouth daily.      Marland Kitchen labetalol (NORMODYNE) 200 MG tablet TAKE 1 TABLET TWICE DAILY. 60 tablet 0  . metFORMIN (GLUCOPHAGE) 500 MG tablet Take 500 mg by mouth 2 (two) times daily with a meal.    . NITROSTAT 0.4 MG SL tablet 1 TAB UNDER TONGUE AS NEEDED FOR CHEST PAIN. MAY REPEAT EVERY 5 MIN FOR A TOTAL OF 3 DOSES. 25 tablet 3  . simvastatin (ZOCOR) 10 MG tablet Take 10 mg by mouth at bedtime.       No current facility-administered  medications on file prior to visit.     No Known Allergies  Past Medical History:  Diagnosis Date  . Coronary artery disease April 2010   3.5- x 15-mm XIENCE DES LAD 95>0/ chronic total occlusion of the dCFX, high-grade stenosis in the OM2, and mCFX, PTCA attempted w/ 1.5 mm balloon, minimal success  . Diabetes mellitus, type 2 ([place])   . History of heart attack    posterior  . Hypercholesterolemia     History of hypercholesterolemia  . Hypertension   . Obesity   . OSA (obstructive sleep apnea)     Past Surgical History:  Procedure Laterality Date  . CARDIAC CATHETERIZATION  01/17/2009   3.5- x 15-mm XIENCE DES LAD 95>0/ CTO dCFX, high-grade stenosis in the OM2, and mCFX, PTCA attempted w/ 1.5 mm balloon, minimal success  . KNEE SURGERY      Social History   Tobacco Use  Smoking Status Former Smoker  . Types: Cigarettes  . Last attempt to quit: 02/03/1986  . Years since quitting: 32.8  Smokeless Tobacco Never Used    Social History   Substance and Sexual Activity  Alcohol Use No    Family History  Problem Relation Age of Onset  .  Hypertension Mother   . Heart attack Father   . Stroke Other        strong family history of   . Heart disease Other        strong family history of  . Hypertension Other        strong family history of     Review of Systems:  As noted in history of present illness. All other systems were reviewed and are negative.  Physical Exam: BP 130/70   Pulse 65   Ht 5\' 8"  (1.727 m)   Wt 238 lb 9.6 oz (108.2 kg)   SpO2 96%   BMI 36.28 kg/m  GENERAL:  Well appearing obese WM in NAD HEENT:  PERRL, EOMI, sclera are clear. Oropharynx is clear. NECK:  No jugular venous distention, carotid upstroke brisk and symmetric, no bruits, no thyromegaly or adenopathy LUNGS:  Clear to auscultation bilaterally CHEST:  Unremarkable HEART:  RRR,  PMI not displaced or sustained,S1 and S2 within normal limits, no S3, no S4: no clicks, no rubs, no  murmurs ABD:  Soft, nontender. BS +, no masses or bruits. No hepatomegaly, no splenomegaly EXT:  2 + pulses throughout, no edema, no cyanosis no clubbing SKIN:  Warm and dry.  No rashes NEURO:  Alert and oriented x 3. Cranial nerves II through XII intact. PSYCH:  Cognitively intact  LABORATORY DATA:  Dated 02/06/16: cholesterol 126, triglycerides 79, HDL 50, LDL 60.  Dated 09/14/16: A1c , creatinine 1.1. A1c 5.9%. Other chemistries normal.  Dated 02/08/17: cholesterol 150, triglycerides 78, HDL 47, LDL 88.  Dated 09/13/17: A1c 6.4%. Creatinine 1.2. Other chemistries OK. Dated 02/09/18: cholesterol 135, triglycerides 81, HDL 46, LDL 73.  Dated 10/10/18: A1c 6.8. creatinine 1.26. other chemistries normal.    Assessment / Plan: 1.CAD. S/p DES of proximal LAD in 2010. Chronic occlusion of mid LCx. He remains asymptomatic. Last  Myoview in October 2015 showed fixed inferior infarct without ischemia. EF 52%.  We will continue ASA 81 mg. Continue beta blocker and amlodipine.  2. HTN under excellent control.  3. DM type 2 on metformin. Last A1c 6.8%. encourage weight loss.   4. Obesity. May want to resume Weight watchers.    5. Hyperlipidemia. On Zocor. LDL 73 at goal  6. RBBB- intermittent and asymptomatic.  7. PVCs asymptomatic continue beta blocker.  I will follow up in one year. If he would need TKR in the near future he is a suitable candidate from my standpoint.

## 2018-12-05 ENCOUNTER — Encounter: Payer: Self-pay | Admitting: Cardiology

## 2018-12-05 ENCOUNTER — Ambulatory Visit (INDEPENDENT_AMBULATORY_CARE_PROVIDER_SITE_OTHER): Payer: Medicare Other | Admitting: Cardiology

## 2018-12-05 VITALS — BP 130/70 | HR 65 | Ht 68.0 in | Wt 238.6 lb

## 2018-12-05 DIAGNOSIS — E119 Type 2 diabetes mellitus without complications: Secondary | ICD-10-CM

## 2018-12-05 DIAGNOSIS — I2510 Atherosclerotic heart disease of native coronary artery without angina pectoris: Secondary | ICD-10-CM | POA: Diagnosis not present

## 2018-12-05 DIAGNOSIS — L720 Epidermal cyst: Secondary | ICD-10-CM | POA: Diagnosis not present

## 2018-12-05 DIAGNOSIS — I10 Essential (primary) hypertension: Secondary | ICD-10-CM | POA: Diagnosis not present

## 2018-12-05 DIAGNOSIS — Z85828 Personal history of other malignant neoplasm of skin: Secondary | ICD-10-CM | POA: Diagnosis not present

## 2018-12-05 DIAGNOSIS — E785 Hyperlipidemia, unspecified: Secondary | ICD-10-CM

## 2019-01-31 ENCOUNTER — Telehealth: Payer: Self-pay | Admitting: Cardiology

## 2019-01-31 DIAGNOSIS — M25511 Pain in right shoulder: Secondary | ICD-10-CM | POA: Diagnosis not present

## 2019-01-31 DIAGNOSIS — M79631 Pain in right forearm: Secondary | ICD-10-CM | POA: Diagnosis not present

## 2019-01-31 DIAGNOSIS — I2510 Atherosclerotic heart disease of native coronary artery without angina pectoris: Secondary | ICD-10-CM | POA: Diagnosis not present

## 2019-01-31 NOTE — Telephone Encounter (Signed)
Symptoms are more musculoskeletal. Advise analgesics, rest and heat. Monitor for now.  Ziggy Chanthavong Martinique MD, [facility]

## 2019-01-31 NOTE — Telephone Encounter (Signed)
Called patient, he advised that for about a week now he has had some right should blade pain- he states if he is sitting it gets better, but if he gets up and begins to move around or stretch the pain comes back and stays. He states at times the pain does go down into his right arm- and when he is laying down in bed and tries to turn over the pain is a 10 on the pain scale. He denies tingling in the arm, SOB, or chest pains, does mention being fatigue and having no energy. He states over the past week he has taken 3 nitroglycerins to see if they would help and they did not. Patient denies change in medication, and is unable to check is BP and HR at this time and has not checked in since last visit. Patient advised to call Primary Care Doctor as well as it could be muscular, but I did advise patient I would send a message back to get recommendations. Patient verbalized understanding.

## 2019-01-31 NOTE — Telephone Encounter (Signed)
Message received from Triage about back pain. I called the patient back.   He describes pain behind right shoulder blade that started a week ago. Pain is intermittent and worse with stretching, bending over, and reaching. He describes a low level of constant minor pain with an  increase in pain when he moves. When the pain first occurred, he took one nitro tablet that relieved the pain 50%. Now the pain moves to center of back between shoulder blades and now is back at right shoulder blade. If upright position, pain is manageable. He does not recall doing a certain activity or lifting anything heavy to precipitate a muscular strain.  He has since taken several nitro tablets that haven't helped at all. Now, his right arm is becoming painful and tingling at times, better with rubbing his arm. He also reports feeling very sleepy and fatigued, which is unusual for him. At times, he seems to describe pain that is worse with movement, but also describes pain that is worse with any exertion and better with rest. Pain is worse with moving around in bed at night and turning in a certain direction.  This pain is very similar to the pain he had in 2010 with his last blockage.   I explained to him that this mostly sounds musculoskeletal, but because it may be with exertion and feels like his pain preceding his heart cath in 2010, I would send a message to Dr. Martinique. Pt has also reached out to his PCP. I also explained that we are not doing outpatient stress tests at this time. I understands and does not want to go to the ER if he doesn't need to be seen.   Tami Lin Duke, PA-C 01/31/2019, 10:24 AM

## 2019-01-31 NOTE — Telephone Encounter (Signed)
New Message:   Patient calling concerning some pain he is having behind his shoulder. Patient would like to speak with a nurse.

## 2019-01-31 NOTE — Telephone Encounter (Signed)
I returned call to patient and let him know Dr. Doug Sou recommendations. He is also about to do a telehealth visit with his PCP. He will call back if anything changes.

## 2019-02-10 ENCOUNTER — Ambulatory Visit
Admission: RE | Admit: 2019-02-10 | Discharge: 2019-02-10 | Disposition: A | Discharge status: Home / Self Care | Payer: Medicare Other | Source: Ambulatory Visit | Attending: Family Medicine | Admitting: Family Medicine

## 2019-02-10 ENCOUNTER — Other Ambulatory Visit: Payer: Self-pay | Admitting: Family Medicine

## 2019-02-10 DIAGNOSIS — M79601 Pain in right arm: Secondary | ICD-10-CM | POA: Diagnosis not present

## 2019-02-10 DIAGNOSIS — M549 Dorsalgia, unspecified: Secondary | ICD-10-CM

## 2019-02-10 DIAGNOSIS — R079 Chest pain, unspecified: Secondary | ICD-10-CM | POA: Diagnosis not present

## 2019-02-10 DIAGNOSIS — I2510 Atherosclerotic heart disease of native coronary artery without angina pectoris: Secondary | ICD-10-CM | POA: Diagnosis not present

## 2019-02-13 DIAGNOSIS — G4733 Obstructive sleep apnea (adult) (pediatric): Secondary | ICD-10-CM | POA: Diagnosis not present

## 2019-02-13 DIAGNOSIS — E1169 Type 2 diabetes mellitus with other specified complication: Secondary | ICD-10-CM | POA: Diagnosis not present

## 2019-02-14 ENCOUNTER — Telehealth: Payer: Self-pay | Admitting: Physician Assistant

## 2019-02-15 ENCOUNTER — Telehealth (INDEPENDENT_AMBULATORY_CARE_PROVIDER_SITE_OTHER): Payer: Medicare Other | Admitting: Physician Assistant

## 2019-02-15 ENCOUNTER — Encounter: Payer: Self-pay | Admitting: Physician Assistant

## 2019-02-15 VITALS — BP 136/85 | HR 67 | Ht 69.0 in | Wt 235.0 lb

## 2019-02-15 DIAGNOSIS — I2510 Atherosclerotic heart disease of native coronary artery without angina pectoris: Secondary | ICD-10-CM

## 2019-02-15 DIAGNOSIS — E119 Type 2 diabetes mellitus without complications: Secondary | ICD-10-CM

## 2019-02-15 DIAGNOSIS — M549 Dorsalgia, unspecified: Secondary | ICD-10-CM

## 2019-02-15 DIAGNOSIS — I10 Essential (primary) hypertension: Secondary | ICD-10-CM

## 2019-02-15 DIAGNOSIS — I4510 Unspecified right bundle-branch block: Secondary | ICD-10-CM

## 2019-02-15 DIAGNOSIS — E785 Hyperlipidemia, unspecified: Secondary | ICD-10-CM

## 2019-02-15 NOTE — Telephone Encounter (Signed)
Virtual Visit Pre-Appointment Phone Call  "(Name), I am calling you today to discuss your upcoming appointment. We are currently trying to limit exposure to the virus that causes COVID-19 by seeing patients at home rather than in the office."  1. "What is the BEST phone number to call the day of the visit?" - include this in appointment notes  2. "Do you have or have access to (through a family member/friend) a smartphone with video capability that we can use for your visit?" a. If yes - list this number in appt notes as "cell" (if different from BEST phone #) and list the appointment type as a VIDEO visit in appointment notes b. If no - list the appointment type as a PHONE visit in appointment notes  3. Confirm consent - "In the setting of the current Covid19 crisis, you are scheduled for a VIDEO visit with your provider on 02/15/2019 at 3:00PM.  Just as we do with many in-office visits, in order for you to participate in this visit, we must obtain consent.  If you'd like, I can send this to your mychart (if signed up) or email for you to review.  Otherwise, I can obtain your verbal consent now.  All virtual visits are billed to your insurance company just like a normal visit would be.  By agreeing to a virtual visit, we'd like you to understand that the technology does not allow for your provider to perform an examination, and thus may limit your provider's ability to fully assess your condition. If your provider identifies any concerns that need to be evaluated in person, we will make arrangements to do so.  Finally, though the technology is pretty good, we cannot assure that it will always work on either your or our end, and in the setting of a video visit, we may have to convert it to a phone-only visit.  In either situation, we cannot ensure that we have a secure connection.  Are you willing to proceed?" STAFF: Did the patient verbally acknowledge consent to telehealth visit? Document YES/NO  here: YES  4. Advise patient to be prepared - "Two hours prior to your appointment, go ahead and check your blood pressure, pulse, oxygen saturation, and your weight (if you have the equipment to check those) and write them all down. When your visit starts, your provider will ask you for this information. If you have an Apple Watch or Kardia device, please plan to have heart rate information ready on the day of your appointment. Please have a pen and paper handy nearby the day of the visit as well."  5. Give patient instructions for MyChart download to smartphone OR Doximity/Doxy.me as below if video visit (depending on what platform provider is using)  6. Inform patient they will receive a phone call 15 minutes prior to their appointment time (may be from unknown caller ID) so they should be prepared to answer    TELEPHONE CALL NOTE  Jon Huang has been deemed a candidate for a follow-up tele-health visit to limit community exposure during the Covid-19 pandemic. I spoke with the patient via phone to ensure availability of phone/video source, confirm preferred email & phone number, and discuss instructions and expectations.  I reminded Jon Huang to be prepared with any vital sign and/or heart rhythm information that could potentially be obtained via home monitoring, at the time of his visit. I reminded Jon Huang to expect a phone call prior to his visit.  Jon Huang, [place] 02/15/2019 4:07 PM   INSTRUCTIONS FOR DOWNLOADING THE MYCHART APP TO SMARTPHONE  - The patient must first make sure to have activated MyChart and know their login information - If Apple, go to [employer] and type in MyChart in the search bar and download the app. If Android, ask patient to go to [employer] and type in [place] in the search bar and download the app. The app is free but as with any other app downloads, their phone may require them to verify saved payment information or Apple/Android  password.  - The patient will need to then log into the app with their MyChart username and password, and select [facility] as their healthcare provider to link the account. When it is time for your visit, go to the MyChart app, find appointments, and click Begin Video Visit. Be sure to Select Allow for your device to access the Microphone and Camera for your visit. You will then be connected, and your provider will be with you shortly.  **If they have any issues connecting, or need assistance please contact MyChart service desk [phone])**  **If using a computer, in order to ensure the best quality for their visit they will need to use either of the following Internet Browsers: [employer], or Google Chrome**  IF USING DOXIMITY or DOXY.ME - The patient will receive a link just prior to their visit by text.     FULL LENGTH CONSENT FOR TELE-HEALTH VISIT   I hereby voluntarily request, consent and authorize [facility] and its employed or contracted physicians, physician assistants, nurse practitioners or other licensed health care professionals (the Practitioner), to provide me with telemedicine health care services (the "Services") as deemed necessary by the treating Practitioner. I acknowledge and consent to receive the Services by the Practitioner via telemedicine. I understand that the telemedicine visit will involve communicating with the Practitioner through live audiovisual communication technology and the disclosure of certain medical information by electronic transmission. I acknowledge that I have been given the opportunity to request an in-person assessment or other available alternative prior to the telemedicine visit and am voluntarily participating in the telemedicine visit.  I understand that I have the right to withhold or withdraw my consent to the use of telemedicine in the course of my care at any time, without affecting my right to future care or treatment,  and that the Practitioner or I may terminate the telemedicine visit at any time. I understand that I have the right to inspect all information obtained and/or recorded in the course of the telemedicine visit and may receive copies of available information for a reasonable fee.  I understand that some of the potential risks of receiving the Services via telemedicine include:  Marland Kitchen Delay or interruption in medical evaluation due to technological equipment failure or disruption; . Information transmitted may not be sufficient (e.g. poor resolution of images) to allow for appropriate medical decision making by the Practitioner; and/or  . In rare instances, security protocols could fail, causing a breach of personal health information.  Furthermore, I acknowledge that it is my responsibility to provide information about my medical history, conditions and care that is complete and accurate to the best of my ability. I acknowledge that Practitioner's advice, recommendations, and/or decision may be based on factors not within their control, such as incomplete or inaccurate data provided by me or distortions of diagnostic images or specimens that may result from electronic transmissions. I understand that the  practice of medicine is not an Chief Strategy Officer and that Practitioner makes no warranties or guarantees regarding treatment outcomes. I acknowledge that I will receive a copy of this consent concurrently upon execution via email to the email address I last provided but may also request a printed copy by calling the office of [facility].    I understand that my insurance will be billed for this visit.   I have read or had this consent read to me. . I understand the contents of this consent, which adequately explains the benefits and risks of the Services being provided via telemedicine.  . I have been provided ample opportunity to ask questions regarding this consent and the Services and have had my questions  answered to my satisfaction. . I give my informed consent for the services to be provided through the use of telemedicine in my medical care  By participating in this telemedicine visit I agree to the above.

## 2019-02-15 NOTE — Progress Notes (Signed)
Virtual Visit via Video Note   This visit type was conducted due to national recommendations for restrictions regarding the COVID-19 Pandemic (e.g. social distancing) in an effort to limit this patient's exposure and mitigate transmission in our community.  Due to his co-morbid illnesses, this patient is at least at moderate risk for complications without adequate follow up.  This format is felt to be most appropriate for this patient at this time.  All issues noted in this document were discussed and addressed.  A limited physical exam was performed with this format.  Please refer to the patient's chart for his consent to telehealth for [facility].   Date:  02/17/2019   ID:  Jon Huang, DOB [birth year], MRN [id number]  Patient Location: Home Provider Location: Home  PCP:  Jon Pepper, MD  Cardiologist:  Peter Martinique, MD  Electrophysiologist:  None   Evaluation Performed:  Follow-Up Visit  Chief Complaint:  Back pain  History of Present Illness:    Jon Huang is a 73 y.o. male with with PMH of CAD s/p DES to LAD, RBBB, HLD, HTN, OSA and DM2.  He had a posterior MI in 1997 with occluded left circumflex.  He was treated medically at the time.  In 2010, he had abnormal stress test and underwent cardiac catheterization demonstrating severe proximal LAD stenosis.  The left circumflex artery occlusion was collateralized distally.  He underwent 3.5 x 15 mm Xience DES placement to LAD.  Attempt was made to open up the left circumflex, however despite the ability to cross with a wire, however the lesion could not be dilated beyond 2.0 mm and the procedure was aborted.  He had a stress Myoview in October 2015 that showed inferior infarct with no ischemia and EF of 52%.    I last saw the patient for preoperative clearance in December 2019 prior to her right ear canal mass excision, he was doing very well at the time.  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  He was last seen by  Dr. Martinique in February 2020, he gained 15 pounds at the time after coming off of weight watchers program.  However he denies any chest pain or shortness of breath.  Patient was contacted today via doximity video conference visit.  His main complaint is back pain and shoulder pain along with numbness on the medial side of his right arm.  The pain is persisted and last days at a time.  The numbness follows dermatome in the C7-T1 distribution.  He denies any obvious chest discomfort.  His symptom is not exacerbated by exertion.  Suspicion of ACS relatively low in this case.  I suspect his symptom is more related to a pinched nerve near the cervical and upper thoracic spinal area.  I recommended him to discuss this with his orthopedic or neurosurgery physician.  Otherwise he denies any lower extremity edema, orthopnea or PND.  The patient does not have symptoms concerning for COVID-19 infection (fever, chills, cough, or new shortness of breath).    Past Medical History:  Diagnosis Date   Coronary artery disease April 2010   3.5- x 15-mm XIENCE DES LAD 95>0/ chronic total occlusion of the dCFX, high-grade stenosis in the OM2, and mCFX, PTCA attempted w/ 1.5 mm balloon, minimal success   Diabetes mellitus, type 2 ([place])    History of heart attack    posterior   Hypercholesterolemia     History of hypercholesterolemia   Hypertension  Obesity    OSA (obstructive sleep apnea)    Past Surgical History:  Procedure Laterality Date   CARDIAC CATHETERIZATION  01/17/2009   3.5- x 15-mm XIENCE DES LAD 95>0/ CTO dCFX, high-grade stenosis in the OM2, and mCFX, PTCA attempted w/ 1.5 mm balloon, minimal success   KNEE SURGERY       Current Meds  Medication Sig   acetaminophen (TYLENOL) 500 MG tablet Take 500 mg by mouth 2 (two) times a day.   amLODipine (NORVASC) 5 MG tablet TAKE 1 TABLET ONCE DAILY.   aspirin EC 81 MG tablet Take 1 tablet (81 mg total) by mouth daily.   benazepril  (LOTENSIN) 40 MG tablet Take 1 tablet by mouth Daily.   hydrochlorothiazide 25 MG tablet Take 25 mg by mouth daily.     labetalol (NORMODYNE) 200 MG tablet TAKE 1 TABLET TWICE DAILY.   metFORMIN (GLUCOPHAGE) 500 MG tablet Take 500 mg by mouth 2 (two) times daily with a meal.   simvastatin (ZOCOR) 10 MG tablet Take 10 mg by mouth at bedtime.       Allergies:   Patient has no known allergies.   Social History   Tobacco Use   Smoking status: Former Smoker    Types: Cigarettes    Last attempt to quit: 02/03/1986    Years since quitting: 33.0   Smokeless tobacco: Never Used  Substance Use Topics   Alcohol use: No   Drug use: No     Family Hx: The patient's family history includes Heart attack in his father; Heart disease in an other family member; Hypertension in his mother and another family member; Stroke in an other family member.  ROS:   Please see the history of present illness.     All other systems reviewed and are negative.   Prior CV studies:   The following studies were reviewed today:  Myoview 07/25/2014 Impression Exercise Capacity: Good exercise capacity. BP Response: Normal blood pressure response. Clinical Symptoms: No significant symptoms noted. ECG Impression: Significant ST abnormalities consistent with ischemia. Comparison with Prior Nuclear Study: direct image comparison to 2010 and 2012 shows identical perfusion pattern   Overall Impression:Low risk stress nuclear study with a large inferior wall scar with mild peri-infarct inferolateral ischemia.    Labs/Other Tests and Data Reviewed:    EKG:  An ECG dated 09/15/2018 was personally reviewed today and demonstrated:  Normal sinus rhythm with right bundle branch block  Recent Labs: No results found for requested labs within last 8760 hours.   Recent Lipid Panel No results found for: CHOL, TRIG, HDL, CHOLHDL, LDLCALC, LDLDIRECT  Wt Readings from Last 3 Encounters:  02/15/19 235 lb  (106.6 kg)  12/05/18 238 lb 9.6 oz (108.2 kg)  09/15/18 236 lb (107 kg)     Objective:    Vital Signs:  BP 136/85 (BP Location: Right Arm, Patient Position: Sitting, Cuff Size: Normal)    Pulse 67    Ht 5\' 9"  (1.753 m)    Wt 235 lb (106.6 kg)    BMI 34.70 kg/m    VITAL SIGNS:  reviewed  ASSESSMENT & PLAN:    1. Back pain: Symptom worsens when he is laying on one side or when he bends his neck.  The symptom is not correlated with exertion.  The numbness in the hand follows dermatome C8-T1 distribution.  I suspect this is more related to pinched nerve rather than true ACS.  He is scheduled to see his orthopedic physician early next  week, he may need to be referred to neurosurgery.  I will hold off on ischemic work-up at this time given the very atypical nature of his symptoms.  2. CAD s/p DES to LAD: On aspirin and Zocor  3. Hypertension: Continue amlodipine, benazepril, hydrochlorothiazide and the labetalol  4. Hyperlipidemia: On Zocor 10 mg daily  5. DM2: Managed by primary care provider.  On metformin   COVID-19 Education: The signs and symptoms of COVID-19 were discussed with the patient and how to seek care for testing (follow up with PCP or arrange E-visit).  The importance of social distancing was discussed today.  Time:   Today, I have spent 19 minutes with the patient with telehealth technology discussing the above problems.     Medication Adjustments/Labs and Tests Ordered: Current medicines are reviewed at length with the patient today.  Concerns regarding medicines are outlined above.   Tests Ordered: No orders of the defined types were placed in this encounter.   Medication Changes: No orders of the defined types were placed in this encounter.   Disposition:  Follow up in 6 month(s)  Signed, Almyra Deforest, [place]  02/17/2019 11:34 PM    [facility]

## 2019-02-15 NOTE — Patient Instructions (Addendum)
Medication Instructions:   Your physician recommends that you continue on your current medications as directed. Please refer to the Current Medication list given to you today.  If you need a refill on your cardiac medications before your next appointment, please call your pharmacy.   Lab work:  NONE ordered at this time of appointment   If you have labs (blood work) drawn today and your tests are completely normal, you will receive your results only by: Marland Kitchen MyChart Message (if you have MyChart) OR . A paper copy in the mail If you have any lab test that is abnormal or we need to change your treatment, we will call you to review the results.  Testing/Procedures:  NONE ordered at this time of appointment   Follow-Up: At [facility], you and your health needs are our priority.  As part of our continuing mission to provide you with exceptional heart care, we have created designated Provider Care Teams.  These Care Teams include your primary Cardiologist (physician) and Advanced Practice Providers (APPs -  Physician Assistants and Nurse Practitioners) who all work together to provide you with the care you need, when you need it. You will need a follow up appointment in 6 months.  Please call our office 2 months in advance to schedule this appointment.  You may see Peter Martinique, MD or one of the following Advanced Practice Providers on your designated Care Team: [place], [place] . Fabian Sharp, PA-C  Any Other Special Instructions Will Be Listed Below (If Applicable).  Call our office if you start to experience chest pain on exertion

## 2019-02-21 DIAGNOSIS — M5412 Radiculopathy, cervical region: Secondary | ICD-10-CM | POA: Diagnosis not present

## 2019-02-21 DIAGNOSIS — M542 Cervicalgia: Secondary | ICD-10-CM | POA: Diagnosis not present

## 2019-02-22 ENCOUNTER — Other Ambulatory Visit: Payer: Self-pay | Admitting: Family Medicine

## 2019-02-22 DIAGNOSIS — I714 Abdominal aortic aneurysm, without rupture, unspecified: Secondary | ICD-10-CM

## 2019-02-22 DIAGNOSIS — M542 Cervicalgia: Secondary | ICD-10-CM | POA: Diagnosis not present

## 2019-02-28 DIAGNOSIS — Z0000 Encounter for general adult medical examination without abnormal findings: Secondary | ICD-10-CM | POA: Diagnosis not present

## 2019-02-28 DIAGNOSIS — I10 Essential (primary) hypertension: Secondary | ICD-10-CM | POA: Diagnosis not present

## 2019-02-28 DIAGNOSIS — E785 Hyperlipidemia, unspecified: Secondary | ICD-10-CM | POA: Diagnosis not present

## 2019-02-28 DIAGNOSIS — M549 Dorsalgia, unspecified: Secondary | ICD-10-CM | POA: Diagnosis not present

## 2019-02-28 DIAGNOSIS — Z125 Encounter for screening for malignant neoplasm of prostate: Secondary | ICD-10-CM | POA: Diagnosis not present

## 2019-02-28 DIAGNOSIS — Z7984 Long term (current) use of oral hypoglycemic drugs: Secondary | ICD-10-CM | POA: Diagnosis not present

## 2019-02-28 DIAGNOSIS — E1169 Type 2 diabetes mellitus with other specified complication: Secondary | ICD-10-CM | POA: Diagnosis not present

## 2019-03-07 DIAGNOSIS — E785 Hyperlipidemia, unspecified: Secondary | ICD-10-CM | POA: Diagnosis not present

## 2019-03-07 DIAGNOSIS — Z7984 Long term (current) use of oral hypoglycemic drugs: Secondary | ICD-10-CM | POA: Diagnosis not present

## 2019-03-07 DIAGNOSIS — I10 Essential (primary) hypertension: Secondary | ICD-10-CM | POA: Diagnosis not present

## 2019-03-07 DIAGNOSIS — E1169 Type 2 diabetes mellitus with other specified complication: Secondary | ICD-10-CM | POA: Diagnosis not present

## 2019-03-07 DIAGNOSIS — Z0000 Encounter for general adult medical examination without abnormal findings: Secondary | ICD-10-CM | POA: Diagnosis not present

## 2019-03-07 DIAGNOSIS — M542 Cervicalgia: Secondary | ICD-10-CM | POA: Diagnosis not present

## 2019-03-07 DIAGNOSIS — Z125 Encounter for screening for malignant neoplasm of prostate: Secondary | ICD-10-CM | POA: Diagnosis not present

## 2019-03-07 DIAGNOSIS — M5030 Other cervical disc degeneration, unspecified cervical region: Secondary | ICD-10-CM | POA: Diagnosis not present

## 2019-03-09 ENCOUNTER — Ambulatory Visit
Admission: RE | Admit: 2019-03-09 | Discharge: 2019-03-09 | Disposition: A | Discharge status: Home / Self Care | Payer: Medicare Other | Source: Ambulatory Visit | Attending: Family Medicine | Admitting: Family Medicine

## 2019-03-09 DIAGNOSIS — I714 Abdominal aortic aneurysm, without rupture, unspecified: Secondary | ICD-10-CM

## 2019-03-15 DIAGNOSIS — M5412 Radiculopathy, cervical region: Secondary | ICD-10-CM | POA: Diagnosis not present

## 2019-03-22 DIAGNOSIS — M542 Cervicalgia: Secondary | ICD-10-CM | POA: Diagnosis not present

## 2019-03-24 ENCOUNTER — Other Ambulatory Visit: Payer: Self-pay | Admitting: Cardiology

## 2019-03-29 DIAGNOSIS — M542 Cervicalgia: Secondary | ICD-10-CM | POA: Diagnosis not present

## 2019-04-05 DIAGNOSIS — M542 Cervicalgia: Secondary | ICD-10-CM | POA: Diagnosis not present

## 2019-04-10 NOTE — Telephone Encounter (Signed)
Opened in error

## 2019-04-17 DIAGNOSIS — Z85828 Personal history of other malignant neoplasm of skin: Secondary | ICD-10-CM | POA: Diagnosis not present

## 2019-04-17 DIAGNOSIS — L821 Other seborrheic keratosis: Secondary | ICD-10-CM | POA: Diagnosis not present

## 2019-04-17 DIAGNOSIS — L910 Hypertrophic scar: Secondary | ICD-10-CM | POA: Diagnosis not present

## 2019-04-17 DIAGNOSIS — L820 Inflamed seborrheic keratosis: Secondary | ICD-10-CM | POA: Diagnosis not present

## 2019-04-19 DIAGNOSIS — M542 Cervicalgia: Secondary | ICD-10-CM | POA: Diagnosis not present

## 2019-04-19 DIAGNOSIS — M5412 Radiculopathy, cervical region: Secondary | ICD-10-CM | POA: Diagnosis not present

## 2019-06-01 DIAGNOSIS — E119 Type 2 diabetes mellitus without complications: Secondary | ICD-10-CM | POA: Diagnosis not present

## 2019-07-07 DIAGNOSIS — I10 Essential (primary) hypertension: Secondary | ICD-10-CM | POA: Diagnosis not present

## 2019-07-07 DIAGNOSIS — G4733 Obstructive sleep apnea (adult) (pediatric): Secondary | ICD-10-CM | POA: Diagnosis not present

## 2019-07-07 DIAGNOSIS — Z7984 Long term (current) use of oral hypoglycemic drugs: Secondary | ICD-10-CM | POA: Diagnosis not present

## 2019-07-07 DIAGNOSIS — E1169 Type 2 diabetes mellitus with other specified complication: Secondary | ICD-10-CM | POA: Diagnosis not present

## 2019-07-07 DIAGNOSIS — E785 Hyperlipidemia, unspecified: Secondary | ICD-10-CM | POA: Diagnosis not present

## 2019-10-31 ENCOUNTER — Telehealth: Payer: Self-pay | Admitting: Cardiology

## 2019-10-31 NOTE — Telephone Encounter (Signed)
We are recommending the COVID-19 vaccine to all of our patients. Cardiac medications (including blood thinners) should not deter anyone from being vaccinated and there is no need to hold any of those medications prior to vaccine administration.     Currently, there is a hotline to call (active 10/20/19) to schedule vaccination appointments as no walk-ins will be accepted.   Number: [phone].    If an appointment is not available please go to [email or web address] to sign up for notification when additional vaccine appointments are available.   If you have further questions or concerns about the vaccine process, please visit [email or web address] or contact your primary care physician.   

## 2019-11-06 DIAGNOSIS — I10 Essential (primary) hypertension: Secondary | ICD-10-CM | POA: Diagnosis not present

## 2019-11-06 DIAGNOSIS — E1169 Type 2 diabetes mellitus with other specified complication: Secondary | ICD-10-CM | POA: Diagnosis not present

## 2019-11-06 DIAGNOSIS — G4733 Obstructive sleep apnea (adult) (pediatric): Secondary | ICD-10-CM | POA: Diagnosis not present

## 2019-11-06 DIAGNOSIS — E785 Hyperlipidemia, unspecified: Secondary | ICD-10-CM | POA: Diagnosis not present

## 2019-11-13 DIAGNOSIS — E785 Hyperlipidemia, unspecified: Secondary | ICD-10-CM | POA: Diagnosis not present

## 2019-11-13 DIAGNOSIS — E1169 Type 2 diabetes mellitus with other specified complication: Secondary | ICD-10-CM | POA: Diagnosis not present

## 2019-11-16 DIAGNOSIS — M79671 Pain in right foot: Secondary | ICD-10-CM | POA: Diagnosis not present

## 2019-11-21 DIAGNOSIS — M25571 Pain in right ankle and joints of right foot: Secondary | ICD-10-CM | POA: Diagnosis not present

## 2019-11-21 DIAGNOSIS — M10071 Idiopathic gout, right ankle and foot: Secondary | ICD-10-CM | POA: Diagnosis not present

## 2019-11-26 ENCOUNTER — Ambulatory Visit: Payer: Medicare Other | Attending: Internal Medicine

## 2019-11-26 DIAGNOSIS — Z23 Encounter for immunization: Secondary | ICD-10-CM | POA: Insufficient documentation

## 2019-11-26 NOTE — Progress Notes (Signed)
   Covid-19 Vaccination Clinic  Name:  Jon Huang    MRN: [id number] DOB: 04-11-1946  11/26/2019  [name] was observed post Covid-19 immunization for 15 minutes without incidence. He was provided with Vaccine Information Sheet and instruction to access the V-Safe system.   [name] was instructed to call 911 with any severe reactions post vaccine: Marland Kitchen Difficulty breathing  . Swelling of your face and throat  . A fast heartbeat  . A bad rash all over your body  . Dizziness and weakness    Immunizations Administered    Name Date Dose VIS Date Route   Pfizer COVID-19 Vaccine 11/26/2019 10:33 AM 0.3 mL 09/22/2019 Intramuscular   Manufacturer: [place]   Lot: [id number]   [place]: [id number]

## 2019-12-08 ENCOUNTER — Telehealth (INDEPENDENT_AMBULATORY_CARE_PROVIDER_SITE_OTHER): Payer: Medicare Other | Admitting: Physician Assistant

## 2019-12-08 ENCOUNTER — Encounter: Payer: Self-pay | Admitting: Physician Assistant

## 2019-12-08 ENCOUNTER — Other Ambulatory Visit: Payer: Self-pay | Admitting: Physician Assistant

## 2019-12-08 VITALS — BP 130/70 | HR 70 | Wt 230.0 lb

## 2019-12-08 DIAGNOSIS — E785 Hyperlipidemia, unspecified: Secondary | ICD-10-CM | POA: Diagnosis not present

## 2019-12-08 DIAGNOSIS — I10 Essential (primary) hypertension: Secondary | ICD-10-CM

## 2019-12-08 DIAGNOSIS — E119 Type 2 diabetes mellitus without complications: Secondary | ICD-10-CM

## 2019-12-08 DIAGNOSIS — I2510 Atherosclerotic heart disease of native coronary artery without angina pectoris: Secondary | ICD-10-CM

## 2019-12-08 MED ORDER — SIMVASTATIN 20 MG PO TABS: 20.0000 mg | ORAL_TABLET | Freq: Every day | ORAL | 3 refills | Status: DC

## 2019-12-08 NOTE — Patient Instructions (Signed)
Medication Instructions:  INCREASE simvastatin to 20mg  daily Continue other current medications  *If you need a refill on your cardiac medications before your next appointment, please call your pharmacy*  Follow-Up: At [facility], you and your health needs are our priority.  As part of our continuing mission to provide you with exceptional heart care, we have created designated Provider Care Teams.  These Care Teams include your primary Cardiologist (physician) and Advanced Practice Providers (APPs -  Physician Assistants and Nurse Practitioners) who all work together to provide you with the care you need, when you need it.  We recommend signing up for the patient portal called "MyChart".  Sign up information is provided on this After Visit Summary.  MyChart is used to connect with patients for Virtual Visits (Telemedicine).  Patients are able to view lab/test results, encounter notes, upcoming appointments, etc.  Non-urgent messages can be sent to your provider as well.   To learn more about what you can do with MyChart, go to [email or web address].    Your next appointment:   9-12 month(s)  The format for your next appointment:   In Person  Provider:   Peter Martinique, MD  Other Instructions

## 2019-12-08 NOTE — Progress Notes (Signed)
Virtual Visit via Telephone Note   This visit type was conducted due to national recommendations for restrictions regarding the COVID-19 Pandemic (e.g. social distancing) in an effort to limit this patient's exposure and mitigate transmission in our community.  Due to his co-morbid illnesses, this patient is at least at moderate risk for complications without adequate follow up.  This format is felt to be most appropriate for this patient at this time.  The patient did not have access to video technology/had technical difficulties with video requiring transitioning to audio format only (telephone).  All issues noted in this document were discussed and addressed.  No physical exam could be performed with this format.  Please refer to the patient's chart for his  consent to telehealth for [facility].   Date:  12/08/2019   ID:  Jon Huang, DOB 1946/01/18, MRN [id number]  Patient Location: Home Provider Location: Office  PCP:  London Pepper, MD  Cardiologist:  Peter Martinique, MD  Electrophysiologist:  None   Evaluation Performed:  Follow-Up Visit  Chief Complaint:  followup  History of Present Illness:    Jon Huang is a 74 y.o. 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 He was treated medically at the time. In 2010, he had abnormal stress test and underwent cardiac catheterization demonstrating severe proximal LAD stenosis. The left circumflex artery occlusion was collateralized distally. He underwent 3.5 x 15 mm XienceDES placement to LAD. Attempt was made to open up the left circumflex, however despite the ability to cross with a wire, however the lesion could not be dilated beyond 2.0 mm and the procedure was aborted. He had a stress Myoview in October 2015 that showed inferior infarct with no ischemia and EF of 52%.     I last saw the patient in May 2020 at which time he complained of  back pain or shoulder pain, I suspect it was a pinched nerve instead of cardiac issue.  Patient presents today for virtual visit.  He denies any chest pain or shortness of breath.  He had a broken toe earlier this year, then afterward, he was diagnosed by orthopedic service with gout.  Symptom has been improving.  Overall, he has been doing quite well from the cardiology perspective.  Last lipid panel obtained in May 2020 showed total cholesterol 174, HDL 49, LDL 98, triglyceride 134.  Ideally, his LDL goal should be less than 70, I recommended increasing Zocor to 20 mg daily.  He can have [place] lab work done the next time he see his primary care provider.  He already had the first shot of Covid vaccine and he is about to get the second [place] vaccine on 12/19/2019.  He can follow-up with Dr. Martinique in 31-month.   The patient does not have symptoms concerning for COVID-19 infection (fever, chills, cough, or new shortness of breath).    Past Medical History:  Diagnosis Date  . Coronary artery disease April 2010   3.5- x 15-mm XIENCE DES LAD 95>0/ chronic total occlusion of the dCFX, high-grade stenosis in the OM2, and mCFX, PTCA attempted w/ 1.5 mm balloon, minimal success  . Diabetes mellitus, type 2 ([place])   . History of heart attack    posterior  . Hypercholesterolemia     History of hypercholesterolemia  . Hypertension   . Obesity   . OSA (obstructive sleep apnea)    Past Surgical History:  Procedure Laterality Date  . CARDIAC CATHETERIZATION  01/17/2009   3.5- x 15-mm XIENCE DES LAD 95>0/ CTO dCFX, high-grade stenosis in the OM2, and mCFX, PTCA attempted w/ 1.5 mm balloon, minimal success  . KNEE SURGERY       Current Meds  Medication Sig  . acetaminophen (TYLENOL) 500 MG tablet Take 500 mg by mouth 2 (two) times a day.  Marland Kitchen amLODipine (NORVASC) 10 MG tablet Take 10 mg by mouth daily.  Marland Kitchen aspirin EC 81 MG tablet Take 1 tablet (81 mg total) by mouth daily.  . benazepril (LOTENSIN) 40 MG  tablet Take 1 tablet by mouth Daily.  Marland Kitchen glipiZIDE (GLUCOTROL XL) 2.5 MG 24 hr tablet Take 2.5 mg by mouth daily.  . hydrochlorothiazide 25 MG tablet Take 25 mg by mouth daily.    Marland Kitchen labetalol (NORMODYNE) 200 MG tablet TAKE 1 TABLET TWICE DAILY.  . metFORMIN (GLUCOPHAGE) 500 MG tablet Take 500 mg by mouth 2 (two) times daily with a meal.  . NITROSTAT 0.4 MG SL tablet 1 TAB UNDER TONGUE AS NEEDED FOR CHEST PAIN. MAY REPEAT EVERY 5 MIN FOR A TOTAL OF 3 DOSES.  Marland Kitchen simvastatin (ZOCOR) 10 MG tablet Take 10 mg by mouth at bedtime.    . [DISCONTINUED] amLODipine (NORVASC) 5 MG tablet TAKE 1 TABLET ONCE DAILY.     Allergies:   Patient has no known allergies.   Social History   Tobacco Use  . Smoking status: Former Smoker    Types: Cigarettes    Quit date: 02/03/1986    Years since quitting: 33.8  . Smokeless tobacco: Never Used  Substance Use Topics  . Alcohol use: No  . Drug use: No     Family Hx: The patient's family history includes Heart attack in his father; Heart disease in an other family member; Hypertension in his mother and another family member; Stroke in an other family member.  ROS:   Please see the history of present illness.     All other systems reviewed and are negative.   Prior CV studies:   The following studies were reviewed today:  Myoview 07/25/2014 Impression Exercise Capacity:  Good exercise capacity. BP Response:  Normal blood pressure response. Clinical Symptoms:  No significant symptoms noted. ECG Impression:  Significant ST abnormalities consistent with ischemia. Comparison with Prior Nuclear Study: direct image comparison to 2010 and 2012 shows identical perfusion pattern   Overall Impression:  Low risk stress nuclear study with a large inferior wall scar with mild peri-infarct inferolateral ischemia.  Labs/Other Tests and Data Reviewed:    EKG:  An ECG dated 09/15/2018 was personally reviewed today and demonstrated:  Normal sinus rhythm with right  bundle branch block.  Recent Labs: No results found for requested labs within last 8760 hours.   Recent Lipid Panel No results found for: CHOL, TRIG, HDL, CHOLHDL, LDLCALC, LDLDIRECT  Wt Readings from Last 3 Encounters:  12/08/19 230 lb (104.3 kg)  02/15/19 235 lb (106.6 kg)  12/05/18 238 lb 9.6 oz (108.2 kg)     Objective:    Vital Signs:  BP 130/70   Pulse 70   Wt 230 lb (104.3 kg)   SpO2 96%   BMI 33.97 kg/m    VITAL SIGNS:  reviewed  ASSESSMENT & PLAN:    1. CAD: Last PCI was in 2010.  Myoview in 2015 showed inferior infarct with no ischemia, EF 52%.  He has not had any recent chest pain.  Continue aspirin and Zocor  2. Hypertension: Blood pressure stable on current therapy  3. Hyperlipidemia: I recommend increase Zocor to 20 mg daily to help further control lipid.  4. DM2: Managed by primary care provider.  On Metformin  COVID-19 Education: The signs and symptoms of COVID-19 were discussed with the patient and how to seek care for testing (follow up with PCP or arrange E-visit).  The importance of social distancing was discussed today.  Time:   Today, I have spent 11 minutes with the patient with telehealth technology discussing the above problems.     Medication Adjustments/Labs and Tests Ordered: Current medicines are reviewed at length with the patient today.  Concerns regarding medicines are outlined above.   Tests Ordered: No orders of the defined types were placed in this encounter.   Medication Changes: No orders of the defined types were placed in this encounter.   Follow Up:  Either In Person or Virtual in [address])  Signed, Almyra Deforest, [place]  12/08/2019 9:28 AM    [place]

## 2019-12-14 DIAGNOSIS — E1169 Type 2 diabetes mellitus with other specified complication: Secondary | ICD-10-CM | POA: Diagnosis not present

## 2019-12-14 DIAGNOSIS — M109 Gout, unspecified: Secondary | ICD-10-CM | POA: Diagnosis not present

## 2019-12-19 ENCOUNTER — Ambulatory Visit: Payer: Medicare Other | Attending: Internal Medicine

## 2019-12-19 DIAGNOSIS — Z23 Encounter for immunization: Secondary | ICD-10-CM | POA: Insufficient documentation

## 2019-12-19 NOTE — Progress Notes (Signed)
   Covid-19 Vaccination Clinic  Name:  COEL LINTZ    MRN: [id number] DOB: [birth year]  12/19/2019  Mr. Ayoub was observed post Covid-19 immunization for 15 minutes without incident. He was provided with Vaccine Information Sheet and instruction to access the V-Safe system.   Mr. Olstad was instructed to call 911 with any severe reactions post vaccine: Marland Kitchen Difficulty breathing  . Swelling of face and throat  . A fast heartbeat  . A bad rash all over body  . Dizziness and weakness   Immunizations Administered    Name Date Dose VIS Date Route   Pfizer COVID-19 Vaccine 12/19/2019 11:49 AM 0.3 mL 09/22/2019 Intramuscular   Manufacturer: [place]   Lot: [id number]   [place]: [id number]

## 2020-01-15 ENCOUNTER — Ambulatory Visit
Admission: RE | Admit: 2020-01-15 | Discharge: 2020-01-15 | Disposition: A | Discharge status: Home / Self Care | Payer: Medicare Other | Source: Ambulatory Visit | Attending: Family Medicine | Admitting: Family Medicine

## 2020-01-15 ENCOUNTER — Encounter (HOSPITAL_COMMUNITY): Payer: Self-pay

## 2020-01-15 ENCOUNTER — Other Ambulatory Visit: Payer: Self-pay | Admitting: Family Medicine

## 2020-01-15 ENCOUNTER — Other Ambulatory Visit: Payer: Self-pay

## 2020-01-15 ENCOUNTER — Emergency Department (HOSPITAL_COMMUNITY)
Admission: EM | Admit: 2020-01-15 | Discharge: 2020-01-16 | Disposition: A | Discharge status: Home / Self Care | Payer: Medicare Other | Attending: Emergency Medicine | Admitting: Emergency Medicine

## 2020-01-15 DIAGNOSIS — I2510 Atherosclerotic heart disease of native coronary artery without angina pectoris: Secondary | ICD-10-CM | POA: Insufficient documentation

## 2020-01-15 DIAGNOSIS — Z87891 Personal history of nicotine dependence: Secondary | ICD-10-CM | POA: Insufficient documentation

## 2020-01-15 DIAGNOSIS — R339 Retention of urine, unspecified: Secondary | ICD-10-CM | POA: Diagnosis present

## 2020-01-15 DIAGNOSIS — N200 Calculus of kidney: Secondary | ICD-10-CM | POA: Diagnosis not present

## 2020-01-15 DIAGNOSIS — I252 Old myocardial infarction: Secondary | ICD-10-CM | POA: Insufficient documentation

## 2020-01-15 DIAGNOSIS — Z7982 Long term (current) use of aspirin: Secondary | ICD-10-CM | POA: Insufficient documentation

## 2020-01-15 DIAGNOSIS — Z79899 Other long term (current) drug therapy: Secondary | ICD-10-CM | POA: Insufficient documentation

## 2020-01-15 DIAGNOSIS — I10 Essential (primary) hypertension: Secondary | ICD-10-CM | POA: Insufficient documentation

## 2020-01-15 DIAGNOSIS — E119 Type 2 diabetes mellitus without complications: Secondary | ICD-10-CM | POA: Insufficient documentation

## 2020-01-15 DIAGNOSIS — Z7984 Long term (current) use of oral hypoglycemic drugs: Secondary | ICD-10-CM | POA: Diagnosis not present

## 2020-01-15 DIAGNOSIS — N132 Hydronephrosis with renal and ureteral calculous obstruction: Secondary | ICD-10-CM | POA: Diagnosis not present

## 2020-01-15 DIAGNOSIS — R109 Unspecified abdominal pain: Secondary | ICD-10-CM | POA: Diagnosis not present

## 2020-01-15 DIAGNOSIS — R112 Nausea with vomiting, unspecified: Secondary | ICD-10-CM | POA: Diagnosis not present

## 2020-01-15 DIAGNOSIS — M549 Dorsalgia, unspecified: Secondary | ICD-10-CM | POA: Diagnosis not present

## 2020-01-15 LAB — URINALYSIS, ROUTINE W REFLEX MICROSCOPIC
Bacteria, UA: NONE SEEN
Bilirubin Urine: NEGATIVE
Glucose, UA: NEGATIVE mg/dL
Ketones, ur: NEGATIVE mg/dL
Leukocytes,Ua: NEGATIVE
Nitrite: NEGATIVE
Protein, ur: 30 mg/dL — AB
Specific Gravity, Urine: 1.018 (ref 1.005–1.030)
pH: 6 (ref 5.0–8.0)

## 2020-01-15 NOTE — ED Provider Notes (Signed)
[facility] DEPT Provider Note  CSN: [id number] Arrival date & time: 01/15/20 2123  Chief Complaint(s) Urinary Retention  HPI Jon Huang is a 74 y.o. male with a past medical history listed below who presents to the emergency department with 1 day of left-sided flank pain with associated nausea and nonbloody nonbilious emesis that began yesterday.  The patient also had right-sided flank pain that had subsided earlier in the day.  Patient felt that he was unable to void and that his bladder was not filling up.  Patient was seen in clinic and had a CT scan performed at 3 PM this afternoon.  CT has not been read but upon my evaluation it appears that the patient has a 4 mm stone in the left ureter with perinephric and periureteral stranding.  Pain is currently mild.  Left flank pain has subsided.  Has mild discomfort in the suprapubic region.  Nausea has resolved.  He denies any fevers or chills.  No diarrhea.  Patient does report decreased bowel movements.  CT scan did not reveal evidence of bowel obstruction.  No other physical complaints.  HPI   Past Medical History Past Medical History:  Diagnosis Date  . Coronary artery disease April 2010   3.5- x 15-mm XIENCE DES LAD 95>0/ chronic total occlusion of the dCFX, high-grade stenosis in the OM2, and mCFX, PTCA attempted w/ 1.5 mm balloon, minimal success  . Diabetes mellitus, type 2 ([place])   . History of heart attack    posterior  . Hypercholesterolemia     History of hypercholesterolemia  . Hypertension   . Obesity   . OSA (obstructive sleep apnea)    Patient Active Problem List   Diagnosis Date Noted  . Hypertension   . Hypercholesterolemia   . Diabetes mellitus, type 2 ([place])   . Obesity   . History of heart attack   . Coronary artery disease 01/10/2009   Home Medication(s) Prior to Admission medications   Medication Sig Start Date End Date Taking? Authorizing Provider  acetaminophen (TYLENOL)  500 MG tablet Take 500 mg by mouth 2 (two) times a day.    [provider]  amLODipine (NORVASC) 10 MG tablet Take 10 mg by mouth daily.    [provider]  aspirin EC 81 MG tablet Take 1 tablet (81 mg total) by mouth daily. 07/10/14   Martinique, Peter M, MD  benazepril (LOTENSIN) 40 MG tablet Take 1 tablet by mouth Daily. 01/11/11   [provider]  glipiZIDE (GLUCOTROL XL) 2.5 MG 24 hr tablet Take 2.5 mg by mouth daily. 11/23/19   [provider]  hydrochlorothiazide 25 MG tablet Take 25 mg by mouth daily.      [provider]  ibuprofen (ADVIL) 600 MG tablet Take 1 tablet (600 mg total) by mouth every 6 (six) hours as needed. 01/16/20   Tamica Covell, Grayce Sessions, MD  labetalol (NORMODYNE) 200 MG tablet TAKE 1 TABLET TWICE DAILY. 09/08/13   Martinique, Peter M, MD  metFORMIN (GLUCOPHAGE) 500 MG tablet Take 500 mg by mouth 2 (two) times daily with a meal.    [provider]  NITROSTAT 0.4 MG SL tablet 1 TAB UNDER TONGUE AS NEEDED FOR CHEST PAIN. MAY REPEAT EVERY 5 MIN FOR A TOTAL OF 3 DOSES. 09/02/16   Martinique, Peter M, MD  simvastatin (ZOCOR) 20 MG tablet Take 1 tablet (20 mg total) by mouth at bedtime. 12/08/19   Almyra Deforest, PA  tamsulosin (FLOMAX) 0.4 MG  CAPS capsule Take 1 capsule (0.4 mg total) by mouth daily for 10 days. 01/16/20 01/26/20  Fatima Blank, MD                                                                                                                                    Past Surgical History Past Surgical History:  Procedure Laterality Date  . CARDIAC CATHETERIZATION  01/17/2009   3.5- x 15-mm XIENCE DES LAD 95>0/ CTO dCFX, high-grade stenosis in the OM2, and mCFX, PTCA attempted w/ 1.5 mm balloon, minimal success  . KNEE SURGERY     Family History Family History  Problem Relation Age of Onset  . Hypertension Mother   . Heart attack Father   . Stroke Other        strong family history of   . Heart disease Other        strong  family history of  . Hypertension Other        strong family history of     Social History Social History   Tobacco Use  . Smoking status: Former Smoker    Types: Cigarettes    Quit date: 02/03/1986    Years since quitting: 33.9  . Smokeless tobacco: Never Used  Substance Use Topics  . Alcohol use: No  . Drug use: No   Allergies Patient has no known allergies.  Review of Systems Review of Systems All other systems are reviewed and are negative for acute change except as noted in the HPI  Physical Exam Vital Signs  I have reviewed the triage vital signs BP (!) 177/110 (BP Location: Left Arm)   Pulse 85   Temp 98.5 F (36.9 C) (Oral)   Resp 18   Ht 5\' 9"  (1.753 m)   Wt 105.7 kg   SpO2 94%   BMI 34.41 kg/m   Physical Exam Vitals reviewed.  Constitutional:      General: He is not in acute distress.    Appearance: He is well-developed. He is not diaphoretic.  HENT:     Head: Normocephalic and atraumatic.     Jaw: No trismus.     Right Ear: External ear normal.     Left Ear: External ear normal.     Nose: Nose normal.  Eyes:     General: No scleral icterus.    Conjunctiva/sclera: Conjunctivae normal.  Neck:     Trachea: Phonation normal.  Cardiovascular:     Rate and Rhythm: Normal rate and regular rhythm.  Pulmonary:     Effort: Pulmonary effort is normal. No respiratory distress.     Breath sounds: No stridor.  Abdominal:     General: There is no distension.     Tenderness: There is no abdominal tenderness.  Musculoskeletal:        General: Normal range of motion.     Cervical back: Normal range of motion.  Neurological:  Mental Status: He is alert and oriented to person, place, and time.  Psychiatric:        Behavior: Behavior normal.     ED Results and Treatments Labs (all labs ordered are listed, but only abnormal results are displayed) Labs Reviewed  URINALYSIS, ROUTINE W REFLEX MICROSCOPIC - Abnormal; Notable for the following  components:      Result Value   Hgb urine dipstick MODERATE (*)    Protein, ur 30 (*)    All other components within normal limits  I-STAT CHEM 8, ED - Abnormal; Notable for the following components:   Chloride 97 (*)    Creatinine, Ser 1.80 (*)    Glucose, Bld 154 (*)    All other components within normal limits                                                                                                                         EKG  EKG Interpretation  Date/Time:    Ventricular Rate:    PR Interval:    QRS Duration:   QT Interval:    QTC Calculation:   R Axis:     Text Interpretation:        Radiology CT ABDOMEN PELVIS WO CONTRAST  Result Date: 01/16/2020 CLINICAL DATA:  Left flank pain. EXAM: CT ABDOMEN AND PELVIS WITHOUT CONTRAST TECHNIQUE: Multidetector CT imaging of the abdomen and pelvis was performed following the standard protocol without IV contrast. COMPARISON:  None. FINDINGS: Lower chest: No acute abnormality. Hepatobiliary: No focal liver abnormality is seen. No gallstones, gallbladder wall thickening, or biliary dilatation. Pancreas: Unremarkable. No pancreatic ductal dilatation or surrounding inflammatory changes. Spleen: Normal in size without focal abnormality. Adrenals/Urinary Tract: Adrenal glands are unremarkable. The kidneys are normal in size. A 1.7 cm cyst is seen within the upper pole of the right kidney. A 1.8 cm cyst is seen within the anterior aspect of the mid left kidney. An additional 3.1 cm cyst is seen within the mid to lower left kidney. 3 mm and 4 mm nonobstructing renal stones are seen within the mid and upper right kidney. A 5 mm nonobstructing renal stone is seen within the mid left kidney. A 4 mm obstructing renal stone is seen within the mid left ureter with mild left-sided hydronephrosis and hydroureter. Mild peri ureteral inflammatory fat stranding is also seen. Bladder is unremarkable. Stomach/Bowel: Stomach is within normal limits. Appendix  appears normal. No evidence of bowel wall thickening, distention, or inflammatory changes. Noninflamed diverticula are seen within the mid sigmoid colon. Vascular/Lymphatic: There is marked severity aortic calcification. No enlarged abdominal or pelvic lymph nodes. Reproductive: The prostate gland is mildly enlarged. Other: No abdominal wall hernia or abnormality. No abdominopelvic ascites. Musculoskeletal: Multilevel degenerative changes seen throughout the lumbar spine. IMPRESSION: 1. Obstructing 4 mm renal stone within the mid left ureter, with mild left-sided hydronephrosis and hydroureter. 2. Bilateral nonobstructing renal stones. 3. Sigmoid diverticulosis without evidence of diverticulitis. 4. Marked severity aortic calcification. Aortic  Atherosclerosis (ICD10-I70.0). Electronically Signed   By: Virgina Norfolk M.D.   On: 01/16/2020 00:57    Pertinent labs & imaging results that were available during my care of the patient were reviewed by me and considered in my medical decision making (see chart for details).  Medications Ordered in ED Medications - No data to display                                                                                                                                  Procedures Procedures  (including critical care time)  Medical Decision Making / ED Course I have reviewed the nursing notes for this encounter and the patient's prior records (if available in EHR or on provided paperwork).   Jon Huang was evaluated in Emergency Department on 01/16/2020 for the symptoms described in the history of present illness. He was evaluated in the context of the global COVID-19 pandemic, which necessitated consideration that the patient might be at risk for infection with the SARS-CoV-2 virus that causes COVID-19. Institutional protocols and algorithms that pertain to the evaluation of patients at risk for COVID-19 are in a state of rapid change based on information  released by regulatory bodies including the CDC and federal and state organizations. These policies and algorithms were followed during the patient's care in the ED.  Bladder scan was performed and revealed approximately 100 cc of urine in the bladder.  Foley catheter is noted by department staff confirming the volume.  UA with hematuria, no evidence of infection.  Asked radiology to read the CT from this afternoon and they confirmed the stone.  No evidence of other intra-abdominal inflammatory/infectious process or bowel obstruction.  Bedside ultrasound confirmed good placement of the catheter.  Do not feel that the patient has urinary retention requiring a Foley catheter thus it was removed.  Symptoms are consistent with ureteral stone.  Pain is currently under control.  Will have patient follow-up with urology.         Final Clinical Impression(s) / ED Diagnoses Final diagnoses:  Renal calculus or stone   The patient appears reasonably screened and/or stabilized for discharge and I doubt any other medical condition or other [facility] requiring further screening, evaluation, or treatment in the ED at this time prior to discharge. Safe for discharge with strict return precautions.  Disposition: Discharge  Condition: Good  I have discussed the results, Dx and Tx plan with the patient/family who expressed understanding and agree(s) with the plan. Discharge instructions discussed at length. The patient/family was given strict return precautions who verbalized understanding of the instructions. No further questions at time of discharge.    ED Discharge Orders         Ordered    ibuprofen (ADVIL) 600 MG tablet  Every 6 hours PRN     01/16/20 0142    tamsulosin (FLOMAX) 0.4 MG CAPS capsule  Daily     01/16/20  0142           Follow Up: Ceasar Mons, MD [address] [place] [address] [phone]  Schedule an appointment as soon as possible for a visit         This chart was dictated using voice recognition software.  Despite best efforts to proofread,  errors can occur which can change the documentation meaning.   Fatima Blank, MD 01/16/20 [phone]

## 2020-01-15 NOTE — ED Triage Notes (Signed)
Pt reports that he has only been able to urinate small amounts of urine since this morning. States that this morning it was only a few drops. Reports bladder pressure and denies any hx of same. Bladder scan revealed 156mL, but is likely more.

## 2020-01-16 DIAGNOSIS — N200 Calculus of kidney: Secondary | ICD-10-CM | POA: Diagnosis not present

## 2020-01-16 LAB — I-STAT CHEM 8, ED
BUN: 21 mg/dL (ref 8–23)
Calcium, Ion: 1.23 mmol/L (ref 1.15–1.40)
Chloride: 97 mmol/L — ABNORMAL LOW (ref 98–111)
Creatinine, Ser: 1.8 mg/dL — ABNORMAL HIGH (ref 0.61–1.24)
Glucose, Bld: 154 mg/dL — ABNORMAL HIGH (ref 70–99)
HCT: 49 % (ref 39.0–52.0)
Hemoglobin: 16.7 g/dL (ref 13.0–17.0)
Potassium: 3.7 mmol/L (ref 3.5–5.1)
Sodium: 137 mmol/L (ref 135–145)
TCO2: 26 mmol/L (ref 22–32)

## 2020-01-16 MED ORDER — IBUPROFEN 600 MG PO TABS: 600.0000 mg | ORAL_TABLET | Freq: Four times a day (QID) | ORAL | 0 refills | Status: DC | PRN

## 2020-01-16 MED ORDER — TAMSULOSIN HCL 0.4 MG PO CAPS: 0.4000 mg | ORAL_CAPSULE | Freq: Every day | ORAL | 0 refills | Status: AC

## 2020-01-18 DIAGNOSIS — N289 Disorder of kidney and ureter, unspecified: Secondary | ICD-10-CM | POA: Diagnosis not present

## 2020-01-19 DIAGNOSIS — N202 Calculus of kidney with calculus of ureter: Secondary | ICD-10-CM | POA: Diagnosis not present

## 2020-01-19 DIAGNOSIS — N281 Cyst of kidney, acquired: Secondary | ICD-10-CM | POA: Diagnosis not present

## 2020-01-20 DIAGNOSIS — N200 Calculus of kidney: Secondary | ICD-10-CM | POA: Diagnosis not present

## 2020-01-23 ENCOUNTER — Other Ambulatory Visit: Payer: Self-pay | Admitting: Urology

## 2020-01-31 ENCOUNTER — Encounter (HOSPITAL_BASED_OUTPATIENT_CLINIC_OR_DEPARTMENT_OTHER): Payer: Self-pay | Admitting: Urology

## 2020-01-31 DIAGNOSIS — N289 Disorder of kidney and ureter, unspecified: Secondary | ICD-10-CM | POA: Diagnosis not present

## 2020-01-31 DIAGNOSIS — N202 Calculus of kidney with calculus of ureter: Secondary | ICD-10-CM | POA: Diagnosis not present

## 2020-02-03 ENCOUNTER — Other Ambulatory Visit (HOSPITAL_COMMUNITY): Payer: Medicare Other

## 2020-02-07 ENCOUNTER — Ambulatory Visit (HOSPITAL_BASED_OUTPATIENT_CLINIC_OR_DEPARTMENT_OTHER): Admit: 2020-02-07 | Payer: Medicare Other | Admitting: Urology

## 2020-02-07 SURGERY — CYSTOURETEROSCOPY, WITH RETROGRADE PYELOGRAM AND STENT INSERTION
Anesthesia: General | Laterality: Bilateral

## 2020-02-19 DIAGNOSIS — G4733 Obstructive sleep apnea (adult) (pediatric): Secondary | ICD-10-CM | POA: Diagnosis not present

## 2020-03-04 DIAGNOSIS — M109 Gout, unspecified: Secondary | ICD-10-CM | POA: Diagnosis not present

## 2020-03-04 DIAGNOSIS — E785 Hyperlipidemia, unspecified: Secondary | ICD-10-CM | POA: Diagnosis not present

## 2020-03-04 DIAGNOSIS — G4733 Obstructive sleep apnea (adult) (pediatric): Secondary | ICD-10-CM | POA: Diagnosis not present

## 2020-03-04 DIAGNOSIS — E1169 Type 2 diabetes mellitus with other specified complication: Secondary | ICD-10-CM | POA: Diagnosis not present

## 2020-03-04 DIAGNOSIS — Z0000 Encounter for general adult medical examination without abnormal findings: Secondary | ICD-10-CM | POA: Diagnosis not present

## 2020-03-04 DIAGNOSIS — Z125 Encounter for screening for malignant neoplasm of prostate: Secondary | ICD-10-CM | POA: Diagnosis not present

## 2020-03-04 DIAGNOSIS — I10 Essential (primary) hypertension: Secondary | ICD-10-CM | POA: Diagnosis not present

## 2020-03-04 DIAGNOSIS — Z1211 Encounter for screening for malignant neoplasm of colon: Secondary | ICD-10-CM | POA: Diagnosis not present

## 2020-03-13 DIAGNOSIS — I10 Essential (primary) hypertension: Secondary | ICD-10-CM | POA: Diagnosis not present

## 2020-04-14 IMAGING — CT CT ABD-PELV W/O CM
1 of 2 series · 14 of 32 positions shown, 19 images · non-contrast
Comparison: None.

CLINICAL DATA: Left flank pain.

EXAM:
CT ABDOMEN AND PELVIS WITHOUT CONTRAST
TECHNIQUE: Multidetector CT imaging of the abdomen and pelvis was performed
following the standard protocol without IV contrast.

[Series 2: abd/pelvis w/(date) · axial · 0.98mm/px · z∈[-509,-89]mm · 14 of 94 slices shown, 19 images]
[im 5/94  soft-tissue]
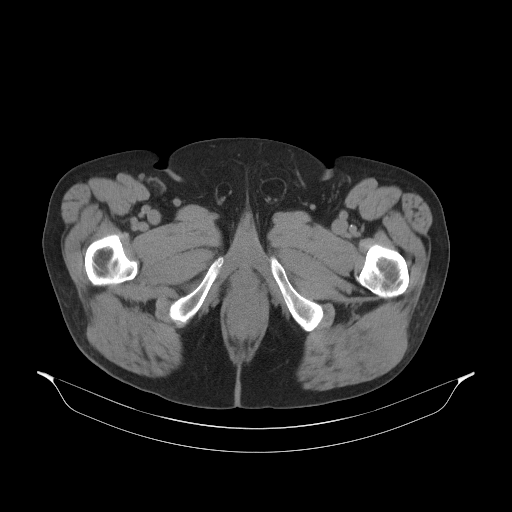
[im 5/94  bone]
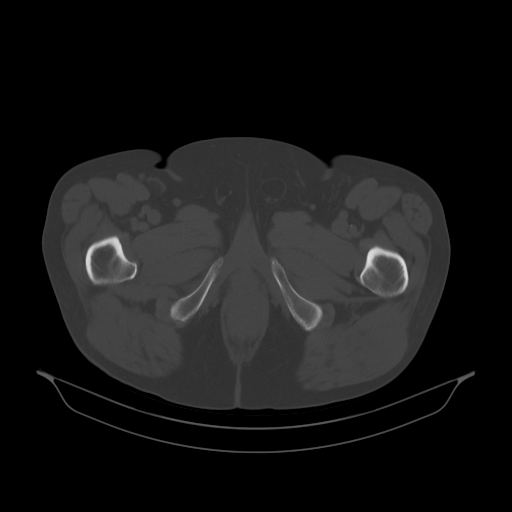
[im 14/94  soft-tissue]
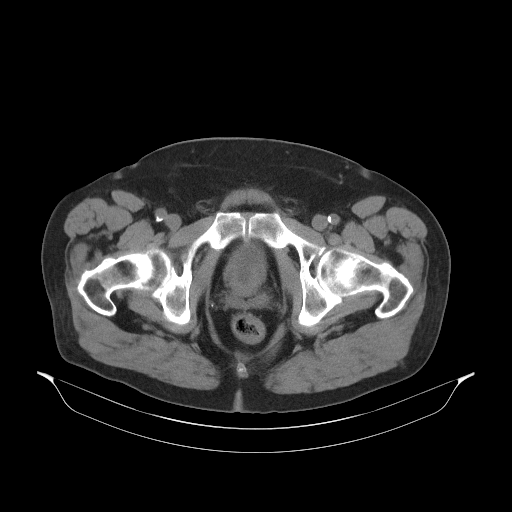
[im 19/94  soft-tissue]
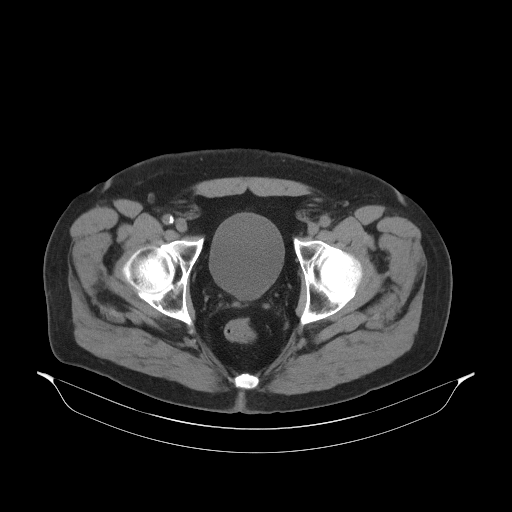
[im 28/94  soft-tissue]
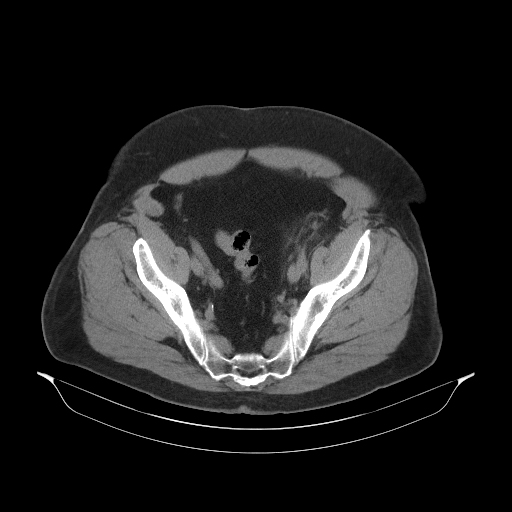
[im 33/94  soft-tissue]
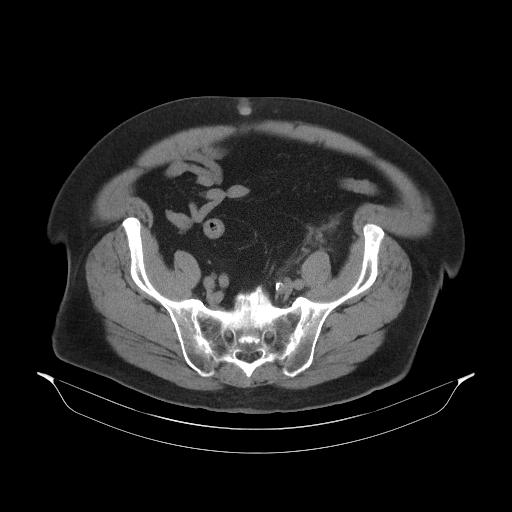
[im 42/94  soft-tissue]
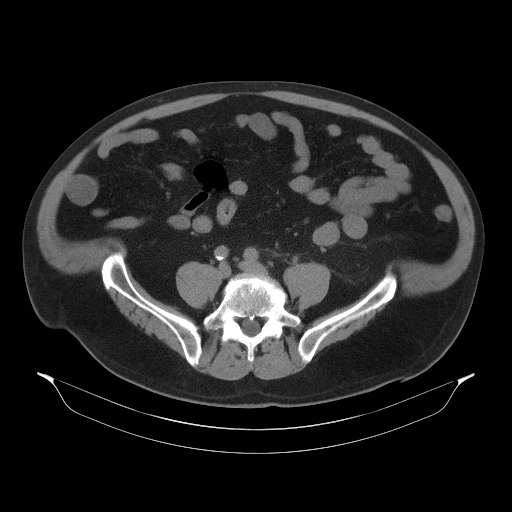
[im 47/94  soft-tissue]
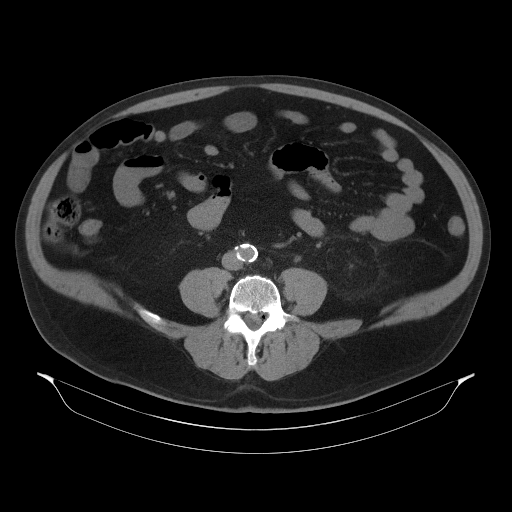
[im 52/94  soft-tissue]
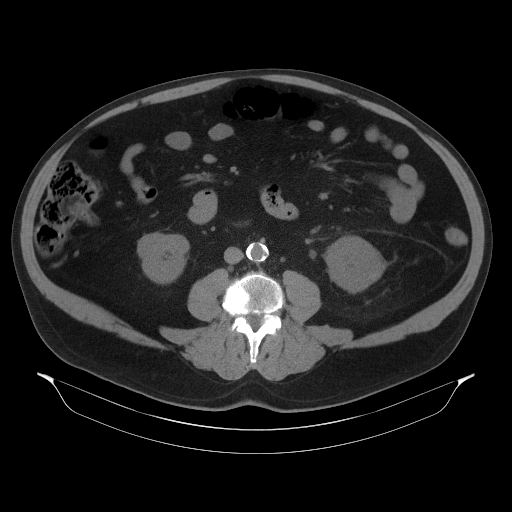
[im 61/94  soft-tissue]
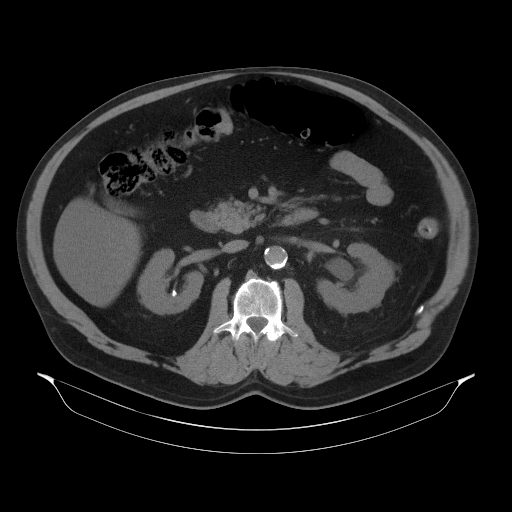
[im 61/94  bone]
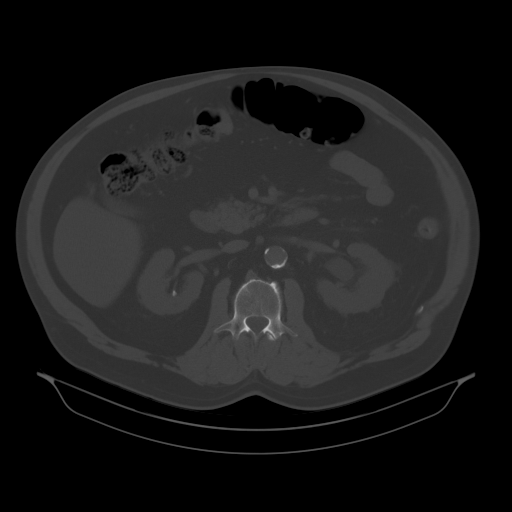
[im 66/94  soft-tissue]
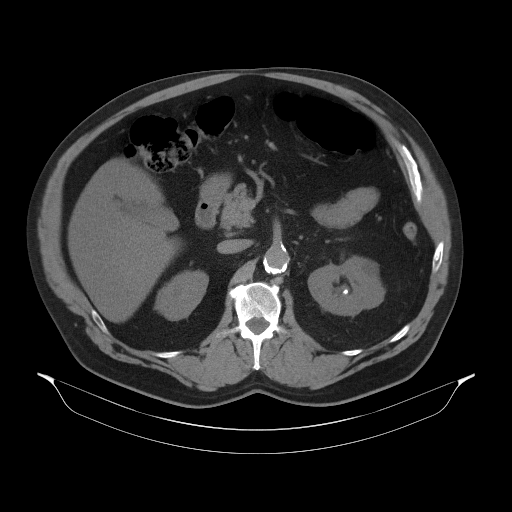
[im 75/94  soft-tissue]
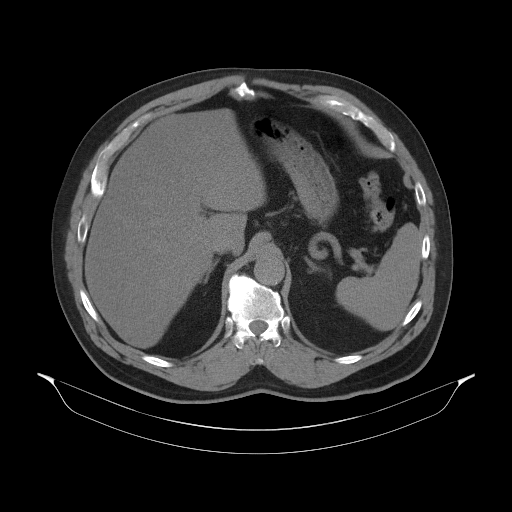
[im 75/94  lung]
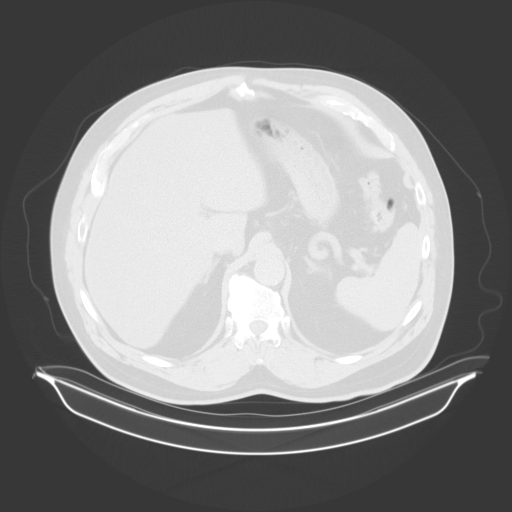
[im 80/94  soft-tissue]
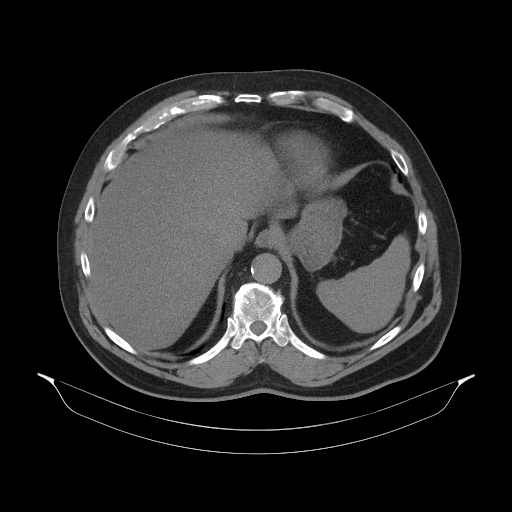
[im 80/94  lung]
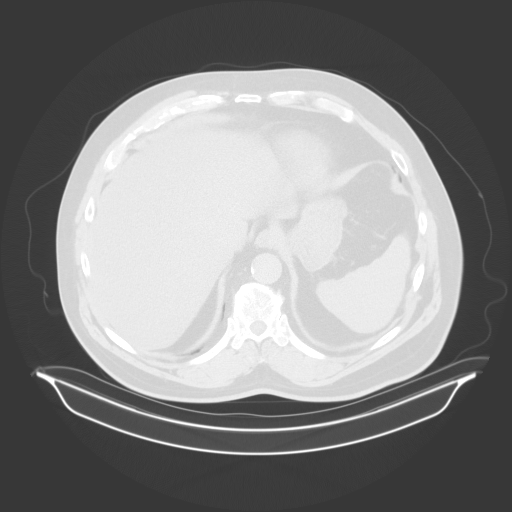
[im 84/94  lung]
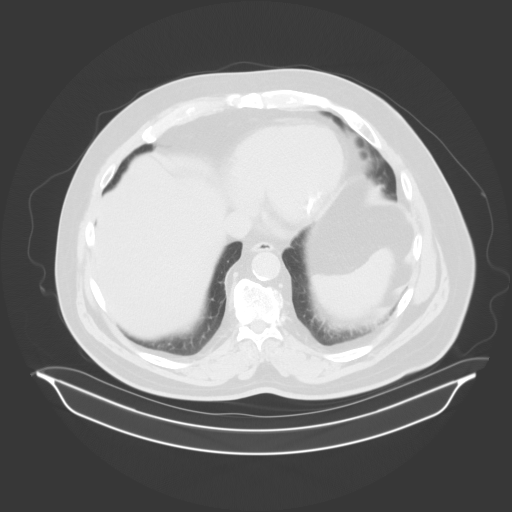
[im 89/94  soft-tissue]
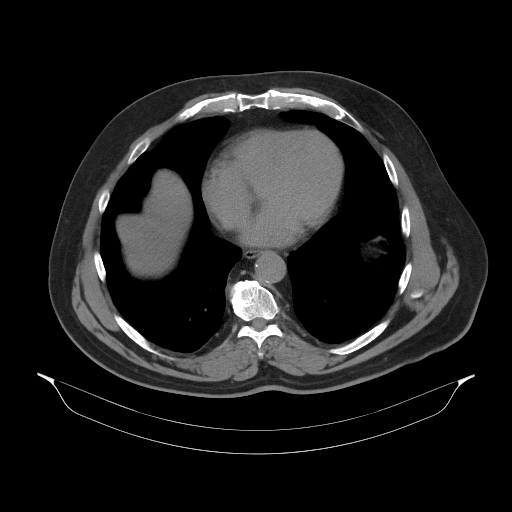
[im 89/94  lung]
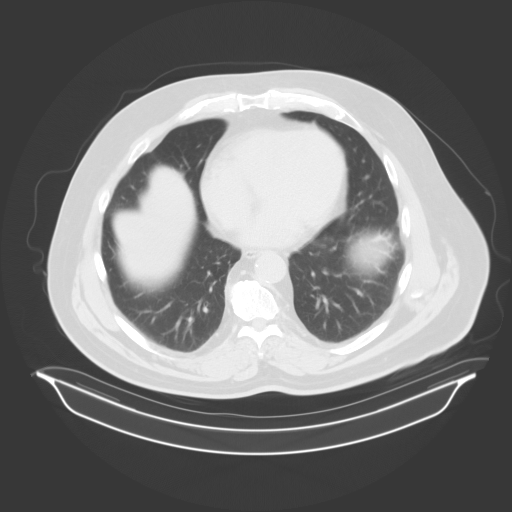

[14 of 32 positions shown; findings below may reference images not displayed]

FINDINGS: Lower chest: No acute abnormality.

Hepatobiliary: No focal liver abnormality is seen. No gallstones,
gallbladder wall thickening, or biliary dilatation.

Pancreas: Unremarkable. No pancreatic ductal dilatation or
surrounding inflammatory changes.

Spleen: Normal in size without focal abnormality.

Adrenals/Urinary Tract: Adrenal glands are unremarkable. The kidneys
are normal in size. A 1.7 cm cyst is seen within the upper pole of
the right kidney. A 1.8 cm cyst is seen within the anterior aspect
of the mid left kidney. An additional 3.1 cm cyst is seen within the
mid to lower left kidney. 3 mm and 4 mm nonobstructing renal stones
are seen within the mid and upper right kidney. A 5 mm
nonobstructing renal stone is seen within the mid left kidney. A 4
mm obstructing renal stone is seen within the mid left ureter with
mild left-sided hydronephrosis and hydroureter. Mild peri ureteral
inflammatory fat stranding is also seen. Bladder is unremarkable.

Stomach/Bowel: Stomach is within normal limits. Appendix appears
normal. No evidence of bowel wall thickening, distention, or
inflammatory changes. Noninflamed diverticula are seen within the
mid sigmoid colon.

Vascular/Lymphatic: There is marked severity aortic calcification.
No enlarged abdominal or pelvic lymph nodes.

Reproductive: The prostate gland is mildly enlarged.

Other: No abdominal wall hernia or abnormality. No abdominopelvic
ascites.

Musculoskeletal: Multilevel degenerative changes seen throughout the
lumbar spine.
IMPRESSION: 1. Obstructing 4 mm renal stone within the mid left ureter, with
mild left-sided hydronephrosis and hydroureter.
2. Bilateral nonobstructing renal stones.
3. Sigmoid diverticulosis without evidence of diverticulitis.
4. Marked severity aortic calcification.

Aortic Atherosclerosis ([id number].E).

## 2020-04-17 DIAGNOSIS — D044 Carcinoma in situ of skin of scalp and neck: Secondary | ICD-10-CM | POA: Diagnosis not present

## 2020-04-17 DIAGNOSIS — L821 Other seborrheic keratosis: Secondary | ICD-10-CM | POA: Diagnosis not present

## 2020-04-17 DIAGNOSIS — L820 Inflamed seborrheic keratosis: Secondary | ICD-10-CM | POA: Diagnosis not present

## 2020-04-17 DIAGNOSIS — Z85828 Personal history of other malignant neoplasm of skin: Secondary | ICD-10-CM | POA: Diagnosis not present

## 2020-04-17 DIAGNOSIS — L812 Freckles: Secondary | ICD-10-CM | POA: Diagnosis not present

## 2020-04-17 DIAGNOSIS — D485 Neoplasm of uncertain behavior of skin: Secondary | ICD-10-CM | POA: Diagnosis not present

## 2020-04-17 DIAGNOSIS — D692 Other nonthrombocytopenic purpura: Secondary | ICD-10-CM | POA: Diagnosis not present

## 2020-04-17 DIAGNOSIS — D1801 Hemangioma of skin and subcutaneous tissue: Secondary | ICD-10-CM | POA: Diagnosis not present

## 2020-05-29 DIAGNOSIS — G4733 Obstructive sleep apnea (adult) (pediatric): Secondary | ICD-10-CM | POA: Diagnosis not present

## 2020-05-29 DIAGNOSIS — I10 Essential (primary) hypertension: Secondary | ICD-10-CM | POA: Diagnosis not present

## 2020-05-29 DIAGNOSIS — E1169 Type 2 diabetes mellitus with other specified complication: Secondary | ICD-10-CM | POA: Diagnosis not present

## 2020-05-29 DIAGNOSIS — E785 Hyperlipidemia, unspecified: Secondary | ICD-10-CM | POA: Diagnosis not present

## 2020-05-29 DIAGNOSIS — M109 Gout, unspecified: Secondary | ICD-10-CM | POA: Diagnosis not present

## 2020-06-03 DIAGNOSIS — E119 Type 2 diabetes mellitus without complications: Secondary | ICD-10-CM | POA: Diagnosis not present

## 2020-06-05 DIAGNOSIS — E785 Hyperlipidemia, unspecified: Secondary | ICD-10-CM | POA: Diagnosis not present

## 2020-06-05 DIAGNOSIS — M109 Gout, unspecified: Secondary | ICD-10-CM | POA: Diagnosis not present

## 2020-06-05 DIAGNOSIS — E1169 Type 2 diabetes mellitus with other specified complication: Secondary | ICD-10-CM | POA: Diagnosis not present

## 2020-07-11 DIAGNOSIS — M5136 Other intervertebral disc degeneration, lumbar region: Secondary | ICD-10-CM | POA: Diagnosis not present

## 2020-07-11 DIAGNOSIS — M545 Low back pain: Secondary | ICD-10-CM | POA: Diagnosis not present

## 2020-07-17 DIAGNOSIS — Z23 Encounter for immunization: Secondary | ICD-10-CM | POA: Diagnosis not present

## 2020-07-31 DIAGNOSIS — Z1159 Encounter for screening for other viral diseases: Secondary | ICD-10-CM | POA: Diagnosis not present

## 2020-08-20 DIAGNOSIS — Z1159 Encounter for screening for other viral diseases: Secondary | ICD-10-CM | POA: Diagnosis not present

## 2020-08-23 DIAGNOSIS — D123 Benign neoplasm of transverse colon: Secondary | ICD-10-CM | POA: Diagnosis not present

## 2020-08-23 DIAGNOSIS — Z86010 Personal history of colonic polyps: Secondary | ICD-10-CM | POA: Diagnosis not present

## 2020-08-23 DIAGNOSIS — D122 Benign neoplasm of ascending colon: Secondary | ICD-10-CM | POA: Diagnosis not present

## 2020-08-27 DIAGNOSIS — D123 Benign neoplasm of transverse colon: Secondary | ICD-10-CM | POA: Diagnosis not present

## 2020-08-27 DIAGNOSIS — D122 Benign neoplasm of ascending colon: Secondary | ICD-10-CM | POA: Diagnosis not present

## 2020-09-10 DIAGNOSIS — I10 Essential (primary) hypertension: Secondary | ICD-10-CM | POA: Diagnosis not present

## 2020-09-10 DIAGNOSIS — E1169 Type 2 diabetes mellitus with other specified complication: Secondary | ICD-10-CM | POA: Diagnosis not present

## 2020-09-10 DIAGNOSIS — I700 Atherosclerosis of aorta: Secondary | ICD-10-CM | POA: Diagnosis not present

## 2020-09-10 DIAGNOSIS — M109 Gout, unspecified: Secondary | ICD-10-CM | POA: Diagnosis not present

## 2020-09-10 DIAGNOSIS — E785 Hyperlipidemia, unspecified: Secondary | ICD-10-CM | POA: Diagnosis not present

## 2020-09-25 DIAGNOSIS — L570 Actinic keratosis: Secondary | ICD-10-CM | POA: Diagnosis not present

## 2020-09-25 DIAGNOSIS — L300 Nummular dermatitis: Secondary | ICD-10-CM | POA: Diagnosis not present

## 2020-09-25 DIAGNOSIS — L820 Inflamed seborrheic keratosis: Secondary | ICD-10-CM | POA: Diagnosis not present

## 2020-09-25 DIAGNOSIS — L821 Other seborrheic keratosis: Secondary | ICD-10-CM | POA: Diagnosis not present

## 2020-09-25 DIAGNOSIS — Z85828 Personal history of other malignant neoplasm of skin: Secondary | ICD-10-CM | POA: Diagnosis not present

## 2020-09-25 DIAGNOSIS — L812 Freckles: Secondary | ICD-10-CM | POA: Diagnosis not present

## 2020-12-09 DIAGNOSIS — E785 Hyperlipidemia, unspecified: Secondary | ICD-10-CM | POA: Diagnosis not present

## 2020-12-09 DIAGNOSIS — E1169 Type 2 diabetes mellitus with other specified complication: Secondary | ICD-10-CM | POA: Diagnosis not present

## 2020-12-09 DIAGNOSIS — I2510 Atherosclerotic heart disease of native coronary artery without angina pectoris: Secondary | ICD-10-CM | POA: Diagnosis not present

## 2020-12-09 DIAGNOSIS — I10 Essential (primary) hypertension: Secondary | ICD-10-CM | POA: Diagnosis not present

## 2021-02-23 ENCOUNTER — Other Ambulatory Visit: Payer: Self-pay | Admitting: Physician Assistant

## 2021-03-27 DIAGNOSIS — N400 Enlarged prostate without lower urinary tract symptoms: Secondary | ICD-10-CM | POA: Diagnosis not present

## 2021-03-27 DIAGNOSIS — E1169 Type 2 diabetes mellitus with other specified complication: Secondary | ICD-10-CM | POA: Diagnosis not present

## 2021-03-27 DIAGNOSIS — R350 Frequency of micturition: Secondary | ICD-10-CM | POA: Diagnosis not present

## 2021-03-27 DIAGNOSIS — I700 Atherosclerosis of aorta: Secondary | ICD-10-CM | POA: Diagnosis not present

## 2021-03-27 DIAGNOSIS — I10 Essential (primary) hypertension: Secondary | ICD-10-CM | POA: Diagnosis not present

## 2021-03-27 DIAGNOSIS — E785 Hyperlipidemia, unspecified: Secondary | ICD-10-CM | POA: Diagnosis not present

## 2021-03-27 DIAGNOSIS — Z0000 Encounter for general adult medical examination without abnormal findings: Secondary | ICD-10-CM | POA: Diagnosis not present

## 2021-03-27 DIAGNOSIS — R946 Abnormal results of thyroid function studies: Secondary | ICD-10-CM | POA: Diagnosis not present

## 2021-03-27 DIAGNOSIS — Z7984 Long term (current) use of oral hypoglycemic drugs: Secondary | ICD-10-CM | POA: Diagnosis not present

## 2021-03-27 DIAGNOSIS — G4733 Obstructive sleep apnea (adult) (pediatric): Secondary | ICD-10-CM | POA: Diagnosis not present

## 2021-03-27 DIAGNOSIS — M109 Gout, unspecified: Secondary | ICD-10-CM | POA: Diagnosis not present

## 2021-05-23 ENCOUNTER — Other Ambulatory Visit: Payer: Self-pay | Admitting: Cardiology

## 2021-05-28 DIAGNOSIS — E039 Hypothyroidism, unspecified: Secondary | ICD-10-CM | POA: Diagnosis not present

## 2021-06-11 DIAGNOSIS — E119 Type 2 diabetes mellitus without complications: Secondary | ICD-10-CM | POA: Diagnosis not present

## 2021-06-27 DIAGNOSIS — I10 Essential (primary) hypertension: Secondary | ICD-10-CM | POA: Diagnosis not present

## 2021-06-27 DIAGNOSIS — E785 Hyperlipidemia, unspecified: Secondary | ICD-10-CM | POA: Diagnosis not present

## 2021-06-27 DIAGNOSIS — E1140 Type 2 diabetes mellitus with diabetic neuropathy, unspecified: Secondary | ICD-10-CM | POA: Diagnosis not present

## 2021-06-27 DIAGNOSIS — I2510 Atherosclerotic heart disease of native coronary artery without angina pectoris: Secondary | ICD-10-CM | POA: Diagnosis not present

## 2021-06-27 DIAGNOSIS — E039 Hypothyroidism, unspecified: Secondary | ICD-10-CM | POA: Diagnosis not present

## 2021-06-27 DIAGNOSIS — E1169 Type 2 diabetes mellitus with other specified complication: Secondary | ICD-10-CM | POA: Diagnosis not present

## 2021-07-16 ENCOUNTER — Other Ambulatory Visit: Payer: Self-pay

## 2021-07-16 ENCOUNTER — Encounter: Payer: Self-pay | Admitting: Podiatry

## 2021-07-16 ENCOUNTER — Ambulatory Visit (INDEPENDENT_AMBULATORY_CARE_PROVIDER_SITE_OTHER): Payer: Medicare Other | Admitting: Podiatry

## 2021-07-16 DIAGNOSIS — M79674 Pain in right toe(s): Secondary | ICD-10-CM

## 2021-07-16 DIAGNOSIS — B351 Tinea unguium: Secondary | ICD-10-CM | POA: Diagnosis not present

## 2021-07-16 DIAGNOSIS — M79675 Pain in left toe(s): Secondary | ICD-10-CM | POA: Diagnosis not present

## 2021-07-16 DIAGNOSIS — E119 Type 2 diabetes mellitus without complications: Secondary | ICD-10-CM | POA: Diagnosis not present

## 2021-07-16 NOTE — Progress Notes (Signed)
  Subjective:  Patient ID: Jon Huang, male    DOB: 1946/03/27,   MRN: [id number]  Chief Complaint  Patient presents with   Nail Problem    RFC Nail trim bilateral nails 1-5    75 y.o. male presents for nail trim and diabetic foot care. Relates he has had diabetes for several years. Hoping to have his nails trimmed. Last A1c was 8.4. Follows with PCP London Pepper .  Marland Kitchen Denies any other pedal complaints. Denies n/v/f/c.   Past Medical History:  Diagnosis Date   Coronary artery disease April 2010   3.5- x 15-mm XIENCE DES LAD 95>0/ chronic total occlusion of the dCFX, high-grade stenosis in the OM2, and mCFX, PTCA attempted w/ 1.5 mm balloon, minimal success   Diabetes mellitus, type 2 ([place])    History of heart attack    posterior   Hypercholesterolemia     History of hypercholesterolemia   Hypertension    Obesity    OSA (obstructive sleep apnea)     Objective:  Physical Exam: Vascular: DP/PT pulses 2/4 bilateral. CFT <3 seconds. Normal hair growth on digits. No edema.  Skin. No lacerations or abrasions bilateral feet. Nails 1-5 are thickened discolored and elongated with subungual debris.  Musculoskeletal: MMT 5/5 bilateral lower extremities in DF, PF, Inversion and Eversion. Deceased ROM in DF of ankle joint.  Neurological: Sensation intact to light touch.   Assessment:   1. Type 2 diabetes mellitus without complication, unspecified whether long term insulin use (HCC)   2. Pain due to onychomycosis of toenail of right foot   3. Pain due to onychomycosis of toenail of left foot      Plan:  Patient was evaluated and treated and all questions answered. -Discussed and educated patient on diabetic foot care, especially with  regards to the vascular, neurological and musculoskeletal systems.  -Stressed the importance of good glycemic control and the detriment of not  controlling glucose levels in relation to the foot. -Discussed supportive shoes at all times and checking  feet regularly.  -Mechanically debrided all nails 1-5 bilateral using sterile nail nipper and filed with dremel without incident  -Answered all patient questions -Patient to return  in 3 months for at risk foot care -Patient advised to call the office if any problems or questions arise in the meantime.   Lorenda Peck, DPM

## 2021-07-18 NOTE — Progress Notes (Signed)
Jon Huang Date of Birth: 21-Oct-1945   History of Present Illness: Jon Huang is seen for followup today. He has a history of CAD. He is s/p posterior MI in 1997 with occlusion of the LCx at that time. He was treated medically. In 2010 he had an abnormal stress test and cardiac cath demonstrated a severe proximal LAD stenosis. The LCx occlusion was collateralized. He had stenting of the LAD with a 3.5 x 15 mm Xience DES. We attempted to open the LCx. Despite crossing with a wire the lesion could not be dilated beyond a 2.0 mm balloon and the procedure was aborted.   His last ischemic evaluation with a stress Myoview study in October 2015 showed an inferior infarct with no ischemia and ejection fraction of 52%.   His last couple of visits have been done via telemedicine.   On follow up today he is doing very well. A1c was elevated to 8.4%. reports he has lost 6 lbs. Started on Ozempic. He denies any chest pain or SOB. He does play golf regularly.   Current Outpatient Medications on File Prior to Visit  Medication Sig Dispense Refill   acetaminophen (TYLENOL) 500 MG tablet Take 500 mg by mouth 2 (two) times a day.     allopurinol (ZYLOPRIM) 100 MG tablet Take by mouth.     amLODipine (NORVASC) 10 MG tablet Take 10 mg by mouth daily.     aspirin EC 81 MG tablet Take 1 tablet (81 mg total) by mouth daily. 90 tablet 3   benazepril (LOTENSIN) 40 MG tablet Take 1 tablet by mouth Daily.     glipiZIDE (GLUCOTROL XL) 2.5 MG 24 hr tablet Take 2.5 mg by mouth daily.     hydrochlorothiazide 25 MG tablet Take 25 mg by mouth daily.       ibuprofen (ADVIL) 600 MG tablet Take 1 tablet (600 mg total) by mouth every 6 (six) hours as needed. 30 tablet 0   labetalol (NORMODYNE) 200 MG tablet TAKE 1 TABLET TWICE DAILY. 60 tablet 0   levothyroxine (SYNTHROID) 25 MCG tablet Take 25 mcg by mouth every morning.     metFORMIN (GLUCOPHAGE) 500 MG tablet Take 500 mg by mouth 2 (two) times daily with a meal.      NITROSTAT 0.4 MG SL tablet 1 TAB UNDER TONGUE AS NEEDED FOR CHEST PAIN. MAY REPEAT EVERY 5 MIN FOR A TOTAL OF 3 DOSES. 25 tablet 3   OZEMPIC, 0.25 OR 0.5 MG/DOSE, 2 MG/1.5ML SOPN Inject into the skin.     simvastatin (ZOCOR) 20 MG tablet TAKE ONE TABLET AT BEDTIME. **NEED OFFICE VISIT** 60 tablet 0   spironolactone (ALDACTONE) 25 MG tablet Take 12.5 mg by mouth daily.     No current facility-administered medications on file prior to visit.    No Known Allergies  Past Medical History:  Diagnosis Date   Coronary artery disease April 2010   3.5- x 15-mm XIENCE DES LAD 95>0/ chronic total occlusion of the dCFX, high-grade stenosis in the OM2, and mCFX, PTCA attempted w/ 1.5 mm balloon, minimal success   Diabetes mellitus, type 2 ([name])    History of heart attack    posterior   Hypercholesterolemia     History of hypercholesterolemia   Hypertension    Obesity    OSA (obstructive sleep apnea)     Past Surgical History:  Procedure Laterality Date   CARDIAC CATHETERIZATION  01/17/2009   3.5- x 15-mm XIENCE DES LAD 95>0/ CTO dCFX, high-grade  stenosis in the OM2, and mCFX, PTCA attempted w/ 1.5 mm balloon, minimal success   KNEE SURGERY      Social History   Tobacco Use  Smoking Status Former   Types: Cigarettes   Quit date: 02/03/1986   Years since quitting: 35.4  Smokeless Tobacco Never    Social History   Substance and Sexual Activity  Alcohol Use No    Family History  Problem Relation Age of Onset   Hypertension Mother    Heart attack Father    Stroke Other        strong family history of    Heart disease Other        strong family history of   Hypertension Other        strong family history of     Review of Systems:  As noted in history of present illness. All other systems were reviewed and are negative.  Physical Exam: BP 130/71   Pulse (!) 53   Ht 5\' 7"  (1.702 m)   Wt 241 lb 3.2 oz (109.4 kg)   SpO2 98%   BMI 37.78 kg/m  GENERAL:  Well appearing obese  WM in NAD HEENT:  PERRL, EOMI, sclera are clear. Oropharynx is clear. NECK:  No jugular venous distention, carotid upstroke brisk and symmetric, no bruits, no thyromegaly or adenopathy LUNGS:  Clear to auscultation bilaterally CHEST:  Unremarkable HEART:  RRR,  PMI not displaced or sustained,S1 and S2 within normal limits, no S3, no S4: no clicks, no rubs, no murmurs ABD:  Soft, nontender. BS +, no masses or bruits. No hepatomegaly, no splenomegaly EXT:  2 + pulses throughout, no edema, no cyanosis no clubbing SKIN:  Warm and dry.  No rashes NEURO:  Alert and oriented x 3. Cranial nerves II through XII intact. PSYCH:  Cognitively intact  LABORATORY DATA:  Dated 02/06/16: cholesterol 126, triglycerides 79, HDL 50, LDL 60.  Dated 09/14/16: A1c , creatinine 1.1. A1c 5.9%. Other chemistries normal.  Dated 02/08/17: cholesterol 150, triglycerides 78, HDL 47, LDL 88.  Dated 09/13/17: A1c 6.4%. Creatinine 1.2. Other chemistries OK. Dated 02/09/18: cholesterol 135, triglycerides 81, HDL 46, LDL 73.  Dated 10/10/18: A1c 6.8. creatinine 1.26. other chemistries normal.  Dated 12/09/20: cholesterol 141, triglycerides 144, HDL 43, LDL 73. Dated 03/27/21: normal LFTs Dated 06/27/21: A1c 8.4%, creatinine 1.23, TSH normal  Ecg today shows NSR with first degree AV block and RBBB. No change. I have personally reviewed and interpreted this study.  Assessment / Plan: 1.CAD. S/p DES of proximal LAD in 2010. Chronic occlusion of mid LCx. He remains asymptomatic. Last  Myoview in October 2015 showed fixed inferior infarct without ischemia. EF 52%.  We will continue ASA 81 mg. Continue beta blocker and amlodipine.  2. HTN under excellent control.  3. DM type 2 on metformin. Last A1c 8.4%. encourage weight loss. Management per primary care.   4. Obesity. Encourage weight loss.   5. Hyperlipidemia. On Zocor. LDL 73 at goal  6. RBBB- intermittent and asymptomatic.  7. PVCs asymptomatic continue beta blocker.

## 2021-07-21 ENCOUNTER — Other Ambulatory Visit: Payer: Self-pay

## 2021-07-21 ENCOUNTER — Encounter: Payer: Self-pay | Admitting: Cardiology

## 2021-07-21 ENCOUNTER — Ambulatory Visit (INDEPENDENT_AMBULATORY_CARE_PROVIDER_SITE_OTHER): Payer: Medicare Other | Admitting: Cardiology

## 2021-07-21 VITALS — BP 130/71 | HR 53 | Ht 67.0 in | Wt 241.2 lb

## 2021-07-21 DIAGNOSIS — I10 Essential (primary) hypertension: Secondary | ICD-10-CM | POA: Diagnosis not present

## 2021-07-21 DIAGNOSIS — E119 Type 2 diabetes mellitus without complications: Secondary | ICD-10-CM

## 2021-07-21 DIAGNOSIS — E785 Hyperlipidemia, unspecified: Secondary | ICD-10-CM | POA: Diagnosis not present

## 2021-07-21 DIAGNOSIS — I2510 Atherosclerotic heart disease of native coronary artery without angina pectoris: Secondary | ICD-10-CM

## 2021-07-22 ENCOUNTER — Other Ambulatory Visit: Payer: Self-pay | Admitting: Cardiology

## 2021-07-24 ENCOUNTER — Encounter: Payer: Medicare Other | Attending: Family Medicine | Admitting: Dietician

## 2021-07-24 ENCOUNTER — Other Ambulatory Visit: Payer: Self-pay

## 2021-07-24 DIAGNOSIS — E119 Type 2 diabetes mellitus without complications: Secondary | ICD-10-CM | POA: Diagnosis not present

## 2021-07-24 NOTE — Progress Notes (Signed)
Patient was seen on 07/24/2021 for the first of a series of three diabetes self-management courses at the [facility].  Pt wife Jon Huang was present during class  RDN provided pt with sample glucometer OneTouch Verio Reflect Lot #:[id number] X S/N: [id number] Exp Date: 03/11/22  Patient Education Plan per assessed needs and concerns is to attend three course education program for Diabetes Self Management Education.  The following learning objectives were met by the patient during this class: Describe diabetes, types of diabetes and pathophysiology State some common risk factors for diabetes Defines the role of glucose and insulin Describe the relationship between diabetes and cardiovascular and other risks State the members of the Healthcare Team States the rationale for glucose monitoring and when to test State their individual [place] the importance of logging glucose readings and how to interpret the readings Identifies A1C target Explain the correlation between A1c and eAG values State symptoms and treatment of high blood glucose and low blood glucose Explain proper technique for glucose testing and identify proper sharps disposal  Handouts given during class include: How to Thrive:  A Guide for Your Journey with Diabetes by the ADA Meal Plan Card and carbohydrate content list Dietary intake form Low Sodium Flavoring Tips Types of Fats Dining Out Label reading Snack list Planning a balanced meal The diabetes portion plate Diabetes Resources A1c to eAG Conversion Chart Blood Glucose Log Diabetes Recommended Care Schedule Support Group Diabetes Success Plan Core Class Satisfaction Survey   Follow-Up Plan: Attend core 2

## 2021-07-31 ENCOUNTER — Encounter: Payer: Self-pay | Admitting: Dietician

## 2021-07-31 ENCOUNTER — Encounter: Payer: Medicare Other | Admitting: Dietician

## 2021-07-31 ENCOUNTER — Other Ambulatory Visit: Payer: Self-pay

## 2021-07-31 DIAGNOSIS — E119 Type 2 diabetes mellitus without complications: Secondary | ICD-10-CM | POA: Diagnosis not present

## 2021-07-31 NOTE — Progress Notes (Signed)
Patient was seen on 07/31/2021 for the second of a series of three diabetes self-management courses at the [facility]. The following learning objectives were met by the patient during this class:  Describe the role of different macronutrients on glucose Explain how carbohydrates affect blood glucose State what foods contain the most carbohydrates Demonstrate carbohydrate counting Demonstrate how to read Nutrition Facts food label Describe effects of various fats on heart health Describe the importance of good nutrition for health and healthy eating strategies Describe techniques for managing your shopping, cooking and meal planning List strategies to follow meal plan when dining out Describe the effects of alcohol on glucose and how to use it safely  Goals:  Follow Diabetes Meal Plan as instructed  Aim to spread carbs evenly throughout the day  Aim for 3 meals per day and snacks as needed Include lean protein foods to meals/snacks  Monitor glucose levels as instructed by your doctor   Follow-Up Plan: Attend Core 3 Work towards following your personal food plan.

## 2021-08-07 ENCOUNTER — Encounter: Payer: Medicare Other | Admitting: Dietician

## 2021-08-07 ENCOUNTER — Other Ambulatory Visit: Payer: Self-pay

## 2021-08-07 ENCOUNTER — Encounter: Payer: Self-pay | Admitting: Dietician

## 2021-08-07 DIAGNOSIS — E119 Type 2 diabetes mellitus without complications: Secondary | ICD-10-CM

## 2021-08-07 NOTE — Progress Notes (Signed)
Patient was seen on 08/07/2021 for the third of a series of three diabetes self-management courses at the [facility].   State the amount of activity recommended for healthy living Describe activities suitable for individual needs Identify ways to regularly incorporate activity into daily life Identify barriers to activity and ways to over come these barriers Identify diabetes medications being personally used and their primary action for lowering glucose and possible side effects Describe role of stress on blood glucose and develop strategies to address psychosocial issues Identify diabetes complications and ways to prevent them Explain how to manage diabetes during illness Evaluate success in meeting personal goal Establish 2-3 goals that they will plan to diligently work on  Goals:  I will count my carb choices at most meals and snacks I will be active 30 minutes or more 3 times a week I will take my diabetes medications as scheduled I will eat less unhealthy fats by eating less bread, pasta I will test my glucose at least 2 times a day, 7 days a week I will look at patterns in my record book at least 4 days a month To help manage stress I will walk at least 3 times a week  Your patient has identified these potential barriers to change:  Motivation   Your patient has identified their diabetes self-care support plan as  [facility] Support Group      Plan:  Attend Support Group as desired

## 2021-08-08 DIAGNOSIS — H9193 Unspecified hearing loss, bilateral: Secondary | ICD-10-CM | POA: Diagnosis not present

## 2021-08-08 DIAGNOSIS — L299 Pruritus, unspecified: Secondary | ICD-10-CM | POA: Diagnosis not present

## 2021-08-11 DIAGNOSIS — E039 Hypothyroidism, unspecified: Secondary | ICD-10-CM | POA: Diagnosis not present

## 2021-09-30 DIAGNOSIS — I10 Essential (primary) hypertension: Secondary | ICD-10-CM | POA: Diagnosis not present

## 2021-09-30 DIAGNOSIS — E785 Hyperlipidemia, unspecified: Secondary | ICD-10-CM | POA: Diagnosis not present

## 2021-09-30 DIAGNOSIS — E1169 Type 2 diabetes mellitus with other specified complication: Secondary | ICD-10-CM | POA: Diagnosis not present

## 2021-09-30 DIAGNOSIS — Z7984 Long term (current) use of oral hypoglycemic drugs: Secondary | ICD-10-CM | POA: Diagnosis not present

## 2021-09-30 DIAGNOSIS — I2510 Atherosclerotic heart disease of native coronary artery without angina pectoris: Secondary | ICD-10-CM | POA: Diagnosis not present

## 2021-09-30 DIAGNOSIS — E1140 Type 2 diabetes mellitus with diabetic neuropathy, unspecified: Secondary | ICD-10-CM | POA: Diagnosis not present

## 2021-09-30 DIAGNOSIS — E039 Hypothyroidism, unspecified: Secondary | ICD-10-CM | POA: Diagnosis not present

## 2021-10-01 DIAGNOSIS — L821 Other seborrheic keratosis: Secondary | ICD-10-CM | POA: Diagnosis not present

## 2021-10-01 DIAGNOSIS — L812 Freckles: Secondary | ICD-10-CM | POA: Diagnosis not present

## 2021-10-01 DIAGNOSIS — L570 Actinic keratosis: Secondary | ICD-10-CM | POA: Diagnosis not present

## 2021-10-01 DIAGNOSIS — L723 Sebaceous cyst: Secondary | ICD-10-CM | POA: Diagnosis not present

## 2021-10-01 DIAGNOSIS — L853 Xerosis cutis: Secondary | ICD-10-CM | POA: Diagnosis not present

## 2021-10-01 DIAGNOSIS — D692 Other nonthrombocytopenic purpura: Secondary | ICD-10-CM | POA: Diagnosis not present

## 2021-10-01 DIAGNOSIS — Z85828 Personal history of other malignant neoplasm of skin: Secondary | ICD-10-CM | POA: Diagnosis not present

## 2021-10-01 DIAGNOSIS — L905 Scar conditions and fibrosis of skin: Secondary | ICD-10-CM | POA: Diagnosis not present

## 2021-10-01 DIAGNOSIS — L820 Inflamed seborrheic keratosis: Secondary | ICD-10-CM | POA: Diagnosis not present

## 2021-10-15 ENCOUNTER — Encounter: Payer: Medicare Other | Attending: Family Medicine | Admitting: Dietician

## 2021-10-15 ENCOUNTER — Encounter: Payer: Self-pay | Admitting: Dietician

## 2021-10-15 ENCOUNTER — Other Ambulatory Visit: Payer: Self-pay

## 2021-10-15 ENCOUNTER — Ambulatory Visit: Payer: Medicare Other | Admitting: Dietician

## 2021-10-15 DIAGNOSIS — E119 Type 2 diabetes mellitus without complications: Secondary | ICD-10-CM | POA: Insufficient documentation

## 2021-10-15 DIAGNOSIS — H903 Sensorineural hearing loss, bilateral: Secondary | ICD-10-CM | POA: Diagnosis not present

## 2021-10-15 NOTE — Progress Notes (Signed)
Patient attended Diabetes Core Classes 1-3 between 07/24/2021 and 08/07/2021 at [facility]. The purpose of the meeting today is to review information learned during those classes as well as review patient application and goals.  Pt wife [name] is present during appointment  What are one or two positive things that you are doing right now to manage your diabetes?   Not eating seconds, started exercising  What is the hardest part about your diabetes right now, causing you the most concern, or is the most worrisome to you about your diabetes?  Affording Semaglutide, pt will be looking   What questions do you have today?  None  Have you participated in any diabetes support group?  No  History:  HTN, CAD, Hyperlipidemia A1C:  6.4 (09/30/2021), started Core program at 8.4 (07/17/2021) Medications include:  Ozempic, Glipizide, metformin Sleep:  Normal Weight:  236.8 lbs Blood Glucose:  Fasting:  120-130  2 hours after starting a meal:  Highest of 175  Have you had any low blood sugar readings in the past month?  No  Social History:    Pt reports difficulty dosing their Ozempic due to lack of instruction. Pt reports mild nausea at a 0.5mg  dose, as well as mild fatigue and agitation. Pt reports vomiting 3-4 days after missing a dose of Ozempic, pt believes this was due to the medication. Pt has started going to the gym in the morning for the past 2 weeks, Pt is doing the treadmill and upper body weights, 30 minutes total. Pt goal is to move to 60 minutes total. Pt reports drinking lots of water daily, pt has a soda stream and drinks 3 16 oz bottles a day.  24 hour diet recall: Breakfast:  Cheerios cereal, strawberries, banana, skim lactaid, black coffee Snack:  None Lunch:  No lunch Snack:  None Dinner:  Pasta w/beef and pork, antipasta, bread, salad, red wine, cookies/desserts Snack:  none Beverages:  black coffee, wine,water   Specific focus but not  limited to the following: Review of blood glucose monitoring and interpretation including the recommended target ranges and Hgb A1c.  Review of carbohydrate counting, importance of regularly scheduled meals/snacks, and meal planning to improve quality of diet. Review of the effects of physical activity on glucose levels and long-term glucose control.  Recommended goal of 150 minutes of physical activity/week. Review of patient medications and discussed role of medication on blood glucose and possible side effects. Discussion of strategies to manage stress, psychosocial issues, and other obstacles to diabetes management. Review of short-term complications: hyper- and hypo-glycemia (causes, symptoms, and treatment options) Review of prevention, detection, and treatment of long-term complications.  Discussion of the role of prolonged elevated glucose levels on body systems.  Continuing Goals: Look for [employer] brand "100 calorie Snack Packs" to take with you when you are running errands. Make sure to have breakfast each morning when taking your glipizide before exercising to avoid low blood sugars. Talk to your PCP about cost effective your medication options for your Ozempic. Keep up the great work exercising!!  Future Follow up:  6 months

## 2021-10-15 NOTE — Patient Instructions (Addendum)
Look for [employer] brand "100 calorie Snack Packs" to take with you when you are running errands.  Try your best to avoid skipping lunch!! Slow down   Make sure to have breakfast each morning when taking your glipizide before exercising to avoid low blood sugars.  Talk to your PCP about cost effective your medication options for your Ozempic.  Keep up the great work exercising!!

## 2021-10-20 ENCOUNTER — Encounter: Payer: Self-pay | Admitting: Podiatry

## 2021-10-20 ENCOUNTER — Other Ambulatory Visit: Payer: Self-pay

## 2021-10-20 ENCOUNTER — Ambulatory Visit (INDEPENDENT_AMBULATORY_CARE_PROVIDER_SITE_OTHER): Payer: Medicare Other | Admitting: Podiatry

## 2021-10-20 DIAGNOSIS — M79675 Pain in left toe(s): Secondary | ICD-10-CM

## 2021-10-20 DIAGNOSIS — B351 Tinea unguium: Secondary | ICD-10-CM

## 2021-10-20 DIAGNOSIS — M79674 Pain in right toe(s): Secondary | ICD-10-CM | POA: Diagnosis not present

## 2021-10-20 DIAGNOSIS — E119 Type 2 diabetes mellitus without complications: Secondary | ICD-10-CM

## 2021-10-20 NOTE — Progress Notes (Signed)
°  Subjective:  Patient ID: Jon Huang, male    DOB: 01-Mar-1946,   MRN: [id number]  Chief Complaint  Patient presents with   debride    DFC-FBS: 143 A1C: 6.3 PCP: Orland Mustard x 4wks     76 y.o. male presents for nail trim and diabetic foot care. Relates he has had diabetes for several years. Hoping to have his nails trimmed. Last A1c was 6.4 . Follows with PCP London Pepper .  Marland Kitchen Denies any other pedal complaints. Denies n/v/f/c.   Past Medical History:  Diagnosis Date   Coronary artery disease April 2010   3.5- x 15-mm XIENCE DES LAD 95>0/ chronic total occlusion of the dCFX, high-grade stenosis in the OM2, and mCFX, PTCA attempted w/ 1.5 mm balloon, minimal success   Diabetes mellitus, type 2 ([place])    History of heart attack    posterior   Hypercholesterolemia     History of hypercholesterolemia   Hypertension    Obesity    OSA (obstructive sleep apnea)     Objective:  Physical Exam: Vascular: DP/PT pulses 2/4 bilateral. CFT <3 seconds. Normal hair growth on digits. No edema.  Skin. No lacerations or abrasions bilateral feet. Nails 1-5 are thickened discolored and elongated with subungual debris.  Musculoskeletal: MMT 5/5 bilateral lower extremities in DF, PF, Inversion and Eversion. Deceased ROM in DF of ankle joint.  Neurological: Sensation intact to light touch.   Assessment:   1. Type 2 diabetes mellitus without complication, unspecified whether long term insulin use (HCC)   2. Pain due to onychomycosis of toenail of left foot   3. Pain due to onychomycosis of toenail of right foot       Plan:  Patient was evaluated and treated and all questions answered. -Discussed and educated patient on diabetic foot care, especially with  regards to the vascular, neurological and musculoskeletal systems.  -Stressed the importance of good glycemic control and the detriment of not  controlling glucose levels in relation to the foot. -Discussed supportive shoes at all times and  checking feet regularly.  -Mechanically debrided all nails 1-5 bilateral using sterile nail nipper and filed with dremel without incident  -Answered all patient questions -Patient to return  in 2 months for at risk foot care -Patient advised to call the office if any problems or questions arise in the meantime.   Lorenda Peck, DPM

## 2021-11-10 DIAGNOSIS — I700 Atherosclerosis of aorta: Secondary | ICD-10-CM | POA: Diagnosis not present

## 2021-11-10 DIAGNOSIS — E1169 Type 2 diabetes mellitus with other specified complication: Secondary | ICD-10-CM | POA: Diagnosis not present

## 2021-11-10 DIAGNOSIS — Z7984 Long term (current) use of oral hypoglycemic drugs: Secondary | ICD-10-CM | POA: Diagnosis not present

## 2021-11-10 DIAGNOSIS — E039 Hypothyroidism, unspecified: Secondary | ICD-10-CM | POA: Diagnosis not present

## 2021-11-29 DIAGNOSIS — R059 Cough, unspecified: Secondary | ICD-10-CM | POA: Diagnosis not present

## 2021-11-29 DIAGNOSIS — J069 Acute upper respiratory infection, unspecified: Secondary | ICD-10-CM | POA: Diagnosis not present

## 2021-11-29 DIAGNOSIS — U071 COVID-19: Secondary | ICD-10-CM | POA: Diagnosis not present

## 2021-11-29 DIAGNOSIS — R0981 Nasal congestion: Secondary | ICD-10-CM | POA: Diagnosis not present

## 2021-12-22 ENCOUNTER — Ambulatory Visit: Payer: Medicare Other | Admitting: Podiatry

## 2021-12-23 ENCOUNTER — Ambulatory Visit (INDEPENDENT_AMBULATORY_CARE_PROVIDER_SITE_OTHER): Payer: Medicare Other | Admitting: Podiatry

## 2021-12-23 ENCOUNTER — Other Ambulatory Visit: Payer: Self-pay

## 2021-12-23 ENCOUNTER — Encounter: Payer: Self-pay | Admitting: Podiatry

## 2021-12-23 DIAGNOSIS — M79674 Pain in right toe(s): Secondary | ICD-10-CM

## 2021-12-23 DIAGNOSIS — E119 Type 2 diabetes mellitus without complications: Secondary | ICD-10-CM | POA: Diagnosis not present

## 2021-12-23 DIAGNOSIS — M79675 Pain in left toe(s): Secondary | ICD-10-CM

## 2021-12-23 DIAGNOSIS — B351 Tinea unguium: Secondary | ICD-10-CM

## 2021-12-23 DIAGNOSIS — Z20822 Contact with and (suspected) exposure to covid-19: Secondary | ICD-10-CM | POA: Diagnosis not present

## 2021-12-23 NOTE — Progress Notes (Signed)
?  Subjective:  ?Patient ID: Jon Huang, male    DOB: 1946-09-22,   MRN: [id number] ? ?Chief Complaint  ?Patient presents with  ? Nail Problem  ?   ?Routine foot care / diabetic foot care  ? ? ?76 y.o. male presents for nail trim and diabetic foot care. Relates he has had diabetes for several years. Hoping to have his nails trimmed. Last A1c was 6.4 . Follows with PCP London Pepper .  Marland Kitchen Denies any other pedal complaints. Denies n/v/f/c.  ? ?Past Medical History:  ?Diagnosis Date  ? Coronary artery disease April 2010  ? 3.5- x 15-mm XIENCE DES LAD 95>0/ chronic total occlusion of the dCFX, high-grade stenosis in the OM2, and mCFX, PTCA attempted w/ 1.5 mm balloon, minimal success  ? Diabetes mellitus, type 2 ([place])   ? History of heart attack   ? posterior  ? Hypercholesterolemia   ?  History of hypercholesterolemia  ? Hypertension   ? Obesity   ? OSA (obstructive sleep apnea)   ? ? ?Objective:  ?Physical Exam: ?Vascular: DP/PT pulses 2/4 bilateral. CFT <3 seconds. Normal hair growth on digits. No edema.  ?Skin. No lacerations or abrasions bilateral feet. Nails 1-5 are thickened discolored and elongated with subungual debris.  ?Musculoskeletal: MMT 5/5 bilateral lower extremities in DF, PF, Inversion and Eversion. Deceased ROM in DF of ankle joint.  ?Neurological: Sensation intact to light touch.  ? ?Assessment:  ? ?No diagnosis found. ? ? ? ? ?Plan:  ?Patient was evaluated and treated and all questions answered. ?-Discussed and educated patient on diabetic foot care, especially with  ?regards to the vascular, neurological and musculoskeletal systems.  ?-Stressed the importance of good glycemic control and the detriment of not  ?controlling glucose levels in relation to the foot. ?-Discussed supportive shoes at all times and checking feet regularly.  ?-Mechanically debrided all nails 1-5 bilateral using sterile nail nipper and filed with dremel without incident  ?-Answered all patient questions ?-Patient to return   in 2 months for at risk foot care ?-Patient advised to call the office if any problems or questions arise in the meantime. ? ? ?Lorenda Peck, DPM  ? ? ?

## 2021-12-29 DIAGNOSIS — E1169 Type 2 diabetes mellitus with other specified complication: Secondary | ICD-10-CM | POA: Diagnosis not present

## 2021-12-29 DIAGNOSIS — E785 Hyperlipidemia, unspecified: Secondary | ICD-10-CM | POA: Diagnosis not present

## 2021-12-29 DIAGNOSIS — G4733 Obstructive sleep apnea (adult) (pediatric): Secondary | ICD-10-CM | POA: Diagnosis not present

## 2021-12-29 DIAGNOSIS — E1140 Type 2 diabetes mellitus with diabetic neuropathy, unspecified: Secondary | ICD-10-CM | POA: Diagnosis not present

## 2021-12-29 DIAGNOSIS — I10 Essential (primary) hypertension: Secondary | ICD-10-CM | POA: Diagnosis not present

## 2021-12-29 DIAGNOSIS — E039 Hypothyroidism, unspecified: Secondary | ICD-10-CM | POA: Diagnosis not present

## 2021-12-29 DIAGNOSIS — I2510 Atherosclerotic heart disease of native coronary artery without angina pectoris: Secondary | ICD-10-CM | POA: Diagnosis not present

## 2022-01-06 DIAGNOSIS — M545 Low back pain, unspecified: Secondary | ICD-10-CM | POA: Diagnosis not present

## 2022-01-06 DIAGNOSIS — M5116 Intervertebral disc disorders with radiculopathy, lumbar region: Secondary | ICD-10-CM | POA: Diagnosis not present

## 2022-01-06 DIAGNOSIS — M47816 Spondylosis without myelopathy or radiculopathy, lumbar region: Secondary | ICD-10-CM | POA: Diagnosis not present

## 2022-01-20 ENCOUNTER — Ambulatory Visit: Payer: PRIVATE HEALTH INSURANCE | Admitting: Podiatry

## 2022-01-29 DIAGNOSIS — M5451 Vertebrogenic low back pain: Secondary | ICD-10-CM | POA: Diagnosis not present

## 2022-02-12 DIAGNOSIS — Z20822 Contact with and (suspected) exposure to covid-19: Secondary | ICD-10-CM | POA: Diagnosis not present

## 2022-02-12 DIAGNOSIS — M5451 Vertebrogenic low back pain: Secondary | ICD-10-CM | POA: Diagnosis not present

## 2022-02-17 DIAGNOSIS — M5451 Vertebrogenic low back pain: Secondary | ICD-10-CM | POA: Diagnosis not present

## 2022-02-26 DIAGNOSIS — M5451 Vertebrogenic low back pain: Secondary | ICD-10-CM | POA: Diagnosis not present

## 2022-03-18 ENCOUNTER — Encounter: Payer: Self-pay | Admitting: Podiatry

## 2022-03-18 ENCOUNTER — Ambulatory Visit (INDEPENDENT_AMBULATORY_CARE_PROVIDER_SITE_OTHER): Payer: Medicare Other | Admitting: Podiatry

## 2022-03-18 DIAGNOSIS — E119 Type 2 diabetes mellitus without complications: Secondary | ICD-10-CM

## 2022-03-18 DIAGNOSIS — M79675 Pain in left toe(s): Secondary | ICD-10-CM

## 2022-03-18 DIAGNOSIS — M79674 Pain in right toe(s): Secondary | ICD-10-CM

## 2022-03-18 DIAGNOSIS — B351 Tinea unguium: Secondary | ICD-10-CM | POA: Diagnosis not present

## 2022-03-18 NOTE — Progress Notes (Signed)
  Subjective:  Patient ID: Jon Huang, male    DOB: [birth year],   MRN: [id number]  No chief complaint on file.   76 y.o. male presents for nail trim and diabetic foot care. Relates he has had diabetes for several years. Hoping to have his nails trimmed. Last A1c was 6.4 . Follows with PCP London Pepper .  Marland Kitchen Denies any other pedal complaints. Denies n/v/f/c.   Past Medical History:  Diagnosis Date   Coronary artery disease April 2010   3.5- x 15-mm XIENCE DES LAD 95>0/ chronic total occlusion of the dCFX, high-grade stenosis in the OM2, and mCFX, PTCA attempted w/ 1.5 mm balloon, minimal success   Diabetes mellitus, type 2 ([place])    History of heart attack    posterior   Hypercholesterolemia     History of hypercholesterolemia   Hypertension    Obesity    OSA (obstructive sleep apnea)     Objective:  Physical Exam: Vascular: DP/PT pulses 2/4 bilateral. CFT <3 seconds. Normal hair growth on digits. No edema.  Skin. No lacerations or abrasions bilateral feet. Nails 1-5 are thickened discolored and elongated with subungual debris.  Musculoskeletal: MMT 5/5 bilateral lower extremities in DF, PF, Inversion and Eversion. Deceased ROM in DF of ankle joint.  Neurological: Sensation intact to light touch.   Assessment:   1. Type 2 diabetes mellitus without complication, unspecified whether long term insulin use (HCC)   2. Pain due to onychomycosis of toenail of right foot   3. Pain due to onychomycosis of toenail of left foot        Plan:  Patient was evaluated and treated and all questions answered. -Discussed and educated patient on diabetic foot care, especially with  regards to the vascular, neurological and musculoskeletal systems.  -Stressed the importance of good glycemic control and the detriment of not  controlling glucose levels in relation to the foot. -Discussed supportive shoes at all times and checking feet regularly.  -Mechanically debrided all nails 1-5  bilateral using sterile nail nipper and filed with dremel without incident  -Answered all patient questions -Patient to return  in 2 months for at risk foot care -Patient advised to call the office if any problems or questions arise in the meantime.   Lorenda Peck, DPM

## 2022-03-24 ENCOUNTER — Ambulatory Visit: Payer: Medicare Other | Admitting: Podiatry

## 2022-05-01 DIAGNOSIS — I2510 Atherosclerotic heart disease of native coronary artery without angina pectoris: Secondary | ICD-10-CM | POA: Diagnosis not present

## 2022-05-01 DIAGNOSIS — Z0000 Encounter for general adult medical examination without abnormal findings: Secondary | ICD-10-CM | POA: Diagnosis not present

## 2022-05-01 DIAGNOSIS — E1169 Type 2 diabetes mellitus with other specified complication: Secondary | ICD-10-CM | POA: Diagnosis not present

## 2022-05-01 DIAGNOSIS — E785 Hyperlipidemia, unspecified: Secondary | ICD-10-CM | POA: Diagnosis not present

## 2022-05-01 DIAGNOSIS — E039 Hypothyroidism, unspecified: Secondary | ICD-10-CM | POA: Diagnosis not present

## 2022-05-01 DIAGNOSIS — M109 Gout, unspecified: Secondary | ICD-10-CM | POA: Diagnosis not present

## 2022-05-04 DIAGNOSIS — I700 Atherosclerosis of aorta: Secondary | ICD-10-CM | POA: Diagnosis not present

## 2022-05-04 DIAGNOSIS — E785 Hyperlipidemia, unspecified: Secondary | ICD-10-CM | POA: Diagnosis not present

## 2022-05-04 DIAGNOSIS — E1169 Type 2 diabetes mellitus with other specified complication: Secondary | ICD-10-CM | POA: Diagnosis not present

## 2022-05-04 DIAGNOSIS — M109 Gout, unspecified: Secondary | ICD-10-CM | POA: Diagnosis not present

## 2022-05-04 DIAGNOSIS — N400 Enlarged prostate without lower urinary tract symptoms: Secondary | ICD-10-CM | POA: Diagnosis not present

## 2022-05-04 DIAGNOSIS — L299 Pruritus, unspecified: Secondary | ICD-10-CM | POA: Diagnosis not present

## 2022-05-04 DIAGNOSIS — G4733 Obstructive sleep apnea (adult) (pediatric): Secondary | ICD-10-CM | POA: Diagnosis not present

## 2022-05-04 DIAGNOSIS — Z0000 Encounter for general adult medical examination without abnormal findings: Secondary | ICD-10-CM | POA: Diagnosis not present

## 2022-05-04 DIAGNOSIS — I10 Essential (primary) hypertension: Secondary | ICD-10-CM | POA: Diagnosis not present

## 2022-05-07 ENCOUNTER — Ambulatory Visit: Payer: Medicare Other | Admitting: Dietician

## 2022-05-18 ENCOUNTER — Other Ambulatory Visit: Payer: Self-pay | Admitting: Cardiology

## 2022-05-18 NOTE — Telephone Encounter (Signed)
MUST KEEP APPT FOR FUTURE REFILLS

## 2022-06-04 DIAGNOSIS — G4733 Obstructive sleep apnea (adult) (pediatric): Secondary | ICD-10-CM | POA: Diagnosis not present

## 2022-06-23 ENCOUNTER — Ambulatory Visit (INDEPENDENT_AMBULATORY_CARE_PROVIDER_SITE_OTHER): Payer: Medicare Other | Admitting: Podiatry

## 2022-06-23 ENCOUNTER — Encounter: Payer: Self-pay | Admitting: Podiatry

## 2022-06-23 DIAGNOSIS — E119 Type 2 diabetes mellitus without complications: Secondary | ICD-10-CM | POA: Diagnosis not present

## 2022-06-23 DIAGNOSIS — B351 Tinea unguium: Secondary | ICD-10-CM | POA: Diagnosis not present

## 2022-06-23 DIAGNOSIS — M79674 Pain in right toe(s): Secondary | ICD-10-CM

## 2022-06-23 NOTE — Progress Notes (Signed)
  Subjective:  Patient ID: Jon Huang, male    DOB: [birth year],   MRN: [id number]  Chief Complaint  Patient presents with   Foot Pain    ROUTINE DIABETIC FOOT CARE, NAIL TRIM     76 y.o. male presents for nail trim and diabetic foot care. Relates he has had diabetes for several years. Hoping to have his nails trimmed. Last A1c was 6.4 . Follows with PCP London Pepper .  Marland Kitchen Denies any other pedal complaints. Denies n/v/f/c.   Past Medical History:  Diagnosis Date   Coronary artery disease April 2010   3.5- x 15-mm XIENCE DES LAD 95>0/ chronic total occlusion of the dCFX, high-grade stenosis in the OM2, and mCFX, PTCA attempted w/ 1.5 mm balloon, minimal success   Diabetes mellitus, type 2 ([place])    History of heart attack    posterior   Hypercholesterolemia     History of hypercholesterolemia   Hypertension    Obesity    OSA (obstructive sleep apnea)     Objective:  Physical Exam: Vascular: DP/PT pulses 2/4 bilateral. CFT <3 seconds. Normal hair growth on digits. No edema.  Skin. No lacerations or abrasions bilateral feet. Nails 1-5 are thickened discolored and elongated with subungual debris.  Musculoskeletal: MMT 5/5 bilateral lower extremities in DF, PF, Inversion and Eversion. Deceased ROM in DF of ankle joint.  Neurological: Sensation intact to light touch.   Assessment:   1. Pain due to onychomycosis of toenail of right foot   2. Type 2 diabetes mellitus without complication, unspecified whether long term insulin use ([place])         Plan:  Patient was evaluated and treated and all questions answered. -Discussed and educated patient on diabetic foot care, especially with  regards to the vascular, neurological and musculoskeletal systems.  -Stressed the importance of good glycemic control and the detriment of not  controlling glucose levels in relation to the foot. -Discussed supportive shoes at all times and checking feet regularly.  -Mechanically debrided all  nails 1-5 bilateral using sterile nail nipper and filed with dremel without incident  -Answered all patient questions -Patient to return  in 2 months for at risk foot care -Patient advised to call the office if any problems or questions arise in the meantime.   Lorenda Peck, DPM

## 2022-06-26 ENCOUNTER — Telehealth: Payer: Self-pay

## 2022-06-26 NOTE — Telephone Encounter (Signed)
Received a cardiac condition check list from [facility] employee health.Form completed and signed by Dr.Jordan.Faxed to fax # [phone].

## 2022-07-09 DIAGNOSIS — E039 Hypothyroidism, unspecified: Secondary | ICD-10-CM | POA: Diagnosis not present

## 2022-07-15 NOTE — Progress Notes (Signed)
Livia Snellen [place] Date of Birth: 1946/10/12   History of Present Illness: Jon Huang is seen for followup today. He has a history of CAD. He is s/p posterior MI in 1997 with occlusion of the LCx at that time. He was treated medically. In 2010 he had an abnormal stress test and cardiac cath demonstrated a severe proximal LAD stenosis. The LCx occlusion was collateralized. He had stenting of the LAD with a 3.5 x 15 mm Xience DES. We attempted to open the LCx. Despite crossing with a wire the lesion could not be dilated beyond a 2.0 mm balloon and the procedure was aborted.   His last ischemic evaluation with a stress Myoview study in October 2015 showed an inferior infarct with no ischemia and ejection fraction of 52%.   On follow up today he is doing very well.  He enjoys playing golf and driving buses. Denies any chest pain or SOB. A1c is improved this year. Tolerating medication well. BP under good control.   Current Outpatient Medications on File Prior to Visit  Medication Sig Dispense Refill   allopurinol (ZYLOPRIM) 100 MG tablet Take by mouth.     amLODipine (NORVASC) 10 MG tablet Take 10 mg by mouth daily.     aspirin EC 81 MG tablet Take 1 tablet (81 mg total) by mouth daily. 90 tablet 3   benazepril (LOTENSIN) 40 MG tablet Take 1 tablet by mouth Daily.     glipiZIDE (GLUCOTROL XL) 2.5 MG 24 hr tablet Take 7.5 mg by mouth daily.     labetalol (NORMODYNE) 200 MG tablet TAKE 1 TABLET TWICE DAILY. 60 tablet 0   levothyroxine (SYNTHROID) 25 MCG tablet Take 25 mcg by mouth every morning.     metFORMIN (GLUCOPHAGE) 500 MG tablet Take 500 mg by mouth 2 (two) times daily with a meal.     NITROSTAT 0.4 MG SL tablet 1 TAB UNDER TONGUE AS NEEDED FOR CHEST PAIN. MAY REPEAT EVERY 5 MIN FOR A TOTAL OF 3 DOSES. 25 tablet 3   simvastatin (ZOCOR) 20 MG tablet Take 1 tablet (20 mg total) by mouth at bedtime. 90 tablet 0   spironolactone (ALDACTONE) 25 MG tablet Take 12.5 mg by mouth daily.     tamsulosin  (FLOMAX) 0.4 MG CAPS capsule TAKE 1 capsule Orally Once a day.     No current facility-administered medications on file prior to visit.    No Known Allergies  Past Medical History:  Diagnosis Date   Coronary artery disease April 2010   3.5- x 15-mm XIENCE DES LAD 95>0/ chronic total occlusion of the dCFX, high-grade stenosis in the OM2, and mCFX, PTCA attempted w/ 1.5 mm balloon, minimal success   Diabetes mellitus, type 2 ([place])    History of heart attack    posterior   Hypercholesterolemia     History of hypercholesterolemia   Hypertension    Obesity    OSA (obstructive sleep apnea)     Past Surgical History:  Procedure Laterality Date   CARDIAC CATHETERIZATION  01/17/2009   3.5- x 15-mm XIENCE DES LAD 95>0/ CTO dCFX, high-grade stenosis in the OM2, and mCFX, PTCA attempted w/ 1.5 mm balloon, minimal success   KNEE SURGERY      Social History   Tobacco Use  Smoking Status Former   Types: Cigarettes   Quit date: 02/03/1986   Years since quitting: 36.4  Smokeless Tobacco Never    Social History   Substance and Sexual Activity  Alcohol Use No  Family History  Problem Relation Age of Onset   Hypertension Mother    Heart attack Father    Stroke Other        strong family history of    Heart disease Other        strong family history of   Hypertension Other        strong family history of     Review of Systems:  As noted in history of present illness. All other systems were reviewed and are negative.  Physical Exam: BP 134/68 (BP Location: Left Arm, Patient Position: Sitting, Cuff Size: Large)   Pulse 65   Ht '5\' 9"'$  (1.753 m)   Wt 241 lb 9.6 oz (109.6 kg)   SpO2 96%   BMI 35.68 kg/m  GENERAL:  Well appearing obese WM in NAD HEENT:  PERRL, EOMI, sclera are clear. Oropharynx is clear. NECK:  No jugular venous distention, carotid upstroke brisk and symmetric, no bruits, no thyromegaly or adenopathy LUNGS:  Clear to auscultation bilaterally CHEST:   Unremarkable HEART:  RRR,  PMI not displaced or sustained,S1 and S2 within normal limits, no S3, no S4: no clicks, no rubs, no murmurs ABD:  Soft, nontender. BS +, no masses or bruits. No hepatomegaly, no splenomegaly EXT:  2 + pulses throughout, no edema, no cyanosis no clubbing SKIN:  Warm and dry.  No rashes NEURO:  Alert and oriented x 3. Cranial nerves II through XII intact. PSYCH:  Cognitively intact  LABORATORY DATA:  Dated 02/06/16: cholesterol 126, triglycerides 79, HDL 50, LDL 60.  Dated 09/14/16: A1c , creatinine 1.1. A1c 5.9%. Other chemistries normal.  Dated 02/08/17: cholesterol 150, triglycerides 78, HDL 47, LDL 88.  Dated 09/13/17: A1c 6.4%. Creatinine 1.2. Other chemistries OK. Dated 02/09/18: cholesterol 135, triglycerides 81, HDL 46, LDL 73.  Dated 10/10/18: A1c 6.8. creatinine 1.26. other chemistries normal.  Dated 12/09/20: cholesterol 141, triglycerides 144, HDL 43, LDL 73. Dated 03/27/21: normal LFTs Dated 06/27/21: A1c 8.4%, creatinine 1.23, TSH normal Dated 05/01/22: A1c 6.4%. cholesterol 142, triglycerides 103, HDL 44, LDL 80. Creatinine 1.17. otherwise CMET normal. Normal TSH and CBC   Ecg today shows NSR with first degree AV block and RBBB. No change. I have personally reviewed and interpreted this study.  Assessment / Plan: 1.CAD. S/p DES of proximal LAD in 2010. Chronic occlusion of mid LCx. He remains asymptomatic. Last  Myoview in October 2015 showed fixed inferior infarct without ischemia. EF 52%.  We will continue ASA 81 mg. Continue beta blocker and amlodipine.  2. HTN under excellent control.  3. DM type 2 on metformin. Last A1c 6.4%. encourage weight loss. Management per primary care.   4. Obesity. Encourage weight loss.   5. Hyperlipidemia. On Zocor. LDL 80. Encourage increased aerobic activity.   6. RBBB- intermittent and asymptomatic.  7. PVCs asymptomatic continue beta blocker.

## 2022-07-17 ENCOUNTER — Ambulatory Visit: Payer: Medicare Other | Attending: Cardiology | Admitting: Cardiology

## 2022-07-17 ENCOUNTER — Encounter: Payer: Self-pay | Admitting: Cardiology

## 2022-07-17 VITALS — BP 134/68 | HR 65 | Ht 69.0 in | Wt 241.6 lb

## 2022-07-17 DIAGNOSIS — E119 Type 2 diabetes mellitus without complications: Secondary | ICD-10-CM | POA: Diagnosis not present

## 2022-07-17 DIAGNOSIS — I2510 Atherosclerotic heart disease of native coronary artery without angina pectoris: Secondary | ICD-10-CM | POA: Diagnosis not present

## 2022-07-17 DIAGNOSIS — E785 Hyperlipidemia, unspecified: Secondary | ICD-10-CM | POA: Diagnosis not present

## 2022-07-17 DIAGNOSIS — I10 Essential (primary) hypertension: Secondary | ICD-10-CM | POA: Diagnosis not present

## 2022-08-05 DIAGNOSIS — H838X3 Other specified diseases of inner ear, bilateral: Secondary | ICD-10-CM | POA: Diagnosis not present

## 2022-08-05 DIAGNOSIS — H608X1 Other otitis externa, right ear: Secondary | ICD-10-CM | POA: Diagnosis not present

## 2022-08-05 DIAGNOSIS — H903 Sensorineural hearing loss, bilateral: Secondary | ICD-10-CM | POA: Diagnosis not present

## 2022-08-06 ENCOUNTER — Other Ambulatory Visit: Payer: Self-pay | Admitting: Cardiology

## 2022-08-07 DIAGNOSIS — E785 Hyperlipidemia, unspecified: Secondary | ICD-10-CM | POA: Diagnosis not present

## 2022-08-07 DIAGNOSIS — E039 Hypothyroidism, unspecified: Secondary | ICD-10-CM | POA: Diagnosis not present

## 2022-08-07 DIAGNOSIS — I10 Essential (primary) hypertension: Secondary | ICD-10-CM | POA: Diagnosis not present

## 2022-08-07 DIAGNOSIS — E1169 Type 2 diabetes mellitus with other specified complication: Secondary | ICD-10-CM | POA: Diagnosis not present

## 2022-08-21 DIAGNOSIS — M545 Low back pain, unspecified: Secondary | ICD-10-CM | POA: Diagnosis not present

## 2022-09-10 DIAGNOSIS — M47896 Other spondylosis, lumbar region: Secondary | ICD-10-CM | POA: Diagnosis not present

## 2022-09-10 DIAGNOSIS — M5136 Other intervertebral disc degeneration, lumbar region: Secondary | ICD-10-CM | POA: Diagnosis not present

## 2022-09-10 DIAGNOSIS — M5116 Intervertebral disc disorders with radiculopathy, lumbar region: Secondary | ICD-10-CM | POA: Diagnosis not present

## 2022-09-16 DIAGNOSIS — E119 Type 2 diabetes mellitus without complications: Secondary | ICD-10-CM | POA: Diagnosis not present

## 2022-09-23 ENCOUNTER — Ambulatory Visit (INDEPENDENT_AMBULATORY_CARE_PROVIDER_SITE_OTHER): Payer: Medicare Other | Admitting: Podiatry

## 2022-09-23 ENCOUNTER — Encounter: Payer: Self-pay | Admitting: Podiatry

## 2022-09-23 VITALS — BP 154/67 | HR 68

## 2022-09-23 DIAGNOSIS — M79674 Pain in right toe(s): Secondary | ICD-10-CM | POA: Diagnosis not present

## 2022-09-23 DIAGNOSIS — B351 Tinea unguium: Secondary | ICD-10-CM | POA: Diagnosis not present

## 2022-09-23 DIAGNOSIS — E119 Type 2 diabetes mellitus without complications: Secondary | ICD-10-CM | POA: Diagnosis not present

## 2022-09-23 NOTE — Progress Notes (Signed)
This patient returns to my office for at risk foot care.  This patient requires this care by a professional since this patient will be at risk due to having  diabetes.  This patient is unable to cut nails himself since the patient cannot reach his nails.These nails are painful walking and wearing shoes.  This patient presents for at risk foot care today.  General Appearance  Alert, conversant and in no acute stress.  Vascular  Dorsalis pedis and posterior tibial  pulses are palpable  bilaterally.  Capillary return is within normal limits  bilaterally. Temperature is within normal limits  bilaterally.  Neurologic  Senn-Weinstein monofilament wire test within normal limits  bilaterally. Muscle power within normal limits bilaterally.  Nails Thick disfigured discolored nails with subungual debris  from hallux to fifth toes bilaterally. No evidence of bacterial infection or drainage bilaterally.  Orthopedic  No limitations of motion  feet .  No crepitus or effusions noted.  No bony pathology or digital deformities noted.  Skin  normotropic skin with no porokeratosis noted bilaterally.  No signs of infections or ulcers noted.     Onychomycosis  Pain in right toes  Pain in left toes  Consent was obtained for treatment procedures.   Mechanical debridement of nails 1-5  bilaterally performed with a nail nipper.  Filed with dremel without incident.    Return office visit   3 months                   Told patient to return for periodic foot care and evaluation due to potential at risk complications.   Kue Fox DPM   

## 2022-09-29 DIAGNOSIS — M545 Low back pain, unspecified: Secondary | ICD-10-CM | POA: Diagnosis not present

## 2022-09-29 DIAGNOSIS — M5451 Vertebrogenic low back pain: Secondary | ICD-10-CM | POA: Diagnosis not present

## 2022-10-07 DIAGNOSIS — M5116 Intervertebral disc disorders with radiculopathy, lumbar region: Secondary | ICD-10-CM | POA: Diagnosis not present

## 2022-10-07 DIAGNOSIS — M5136 Other intervertebral disc degeneration, lumbar region: Secondary | ICD-10-CM | POA: Diagnosis not present

## 2022-10-07 DIAGNOSIS — M47816 Spondylosis without myelopathy or radiculopathy, lumbar region: Secondary | ICD-10-CM | POA: Diagnosis not present

## 2022-10-28 DIAGNOSIS — Z85828 Personal history of other malignant neoplasm of skin: Secondary | ICD-10-CM | POA: Diagnosis not present

## 2022-10-28 DIAGNOSIS — L812 Freckles: Secondary | ICD-10-CM | POA: Diagnosis not present

## 2022-10-28 DIAGNOSIS — L820 Inflamed seborrheic keratosis: Secondary | ICD-10-CM | POA: Diagnosis not present

## 2022-10-28 DIAGNOSIS — L720 Epidermal cyst: Secondary | ICD-10-CM | POA: Diagnosis not present

## 2022-10-28 DIAGNOSIS — L821 Other seborrheic keratosis: Secondary | ICD-10-CM | POA: Diagnosis not present

## 2022-10-29 DIAGNOSIS — M5451 Vertebrogenic low back pain: Secondary | ICD-10-CM | POA: Diagnosis not present

## 2022-11-02 DIAGNOSIS — M5451 Vertebrogenic low back pain: Secondary | ICD-10-CM | POA: Diagnosis not present

## 2022-11-06 DIAGNOSIS — M5451 Vertebrogenic low back pain: Secondary | ICD-10-CM | POA: Diagnosis not present

## 2022-11-10 DIAGNOSIS — M5451 Vertebrogenic low back pain: Secondary | ICD-10-CM | POA: Diagnosis not present

## 2022-11-16 DIAGNOSIS — J988 Other specified respiratory disorders: Secondary | ICD-10-CM | POA: Diagnosis not present

## 2022-11-16 DIAGNOSIS — R0989 Other specified symptoms and signs involving the circulatory and respiratory systems: Secondary | ICD-10-CM | POA: Diagnosis not present

## 2022-11-16 DIAGNOSIS — I10 Essential (primary) hypertension: Secondary | ICD-10-CM | POA: Diagnosis not present

## 2022-11-16 DIAGNOSIS — Z03818 Encounter for observation for suspected exposure to other biological agents ruled out: Secondary | ICD-10-CM | POA: Diagnosis not present

## 2022-11-20 DIAGNOSIS — I700 Atherosclerosis of aorta: Secondary | ICD-10-CM | POA: Diagnosis not present

## 2022-11-20 DIAGNOSIS — I10 Essential (primary) hypertension: Secondary | ICD-10-CM | POA: Diagnosis not present

## 2022-11-20 DIAGNOSIS — E785 Hyperlipidemia, unspecified: Secondary | ICD-10-CM | POA: Diagnosis not present

## 2022-11-20 DIAGNOSIS — E1169 Type 2 diabetes mellitus with other specified complication: Secondary | ICD-10-CM | POA: Diagnosis not present

## 2022-11-20 DIAGNOSIS — E039 Hypothyroidism, unspecified: Secondary | ICD-10-CM | POA: Diagnosis not present

## 2022-11-26 ENCOUNTER — Other Ambulatory Visit: Payer: Self-pay | Admitting: Family Medicine

## 2022-11-26 DIAGNOSIS — R7989 Other specified abnormal findings of blood chemistry: Secondary | ICD-10-CM

## 2022-12-24 ENCOUNTER — Ambulatory Visit
Admission: RE | Admit: 2022-12-24 | Discharge: 2022-12-24 | Disposition: A | Discharge status: Home / Self Care | Payer: Medicare Other | Source: Ambulatory Visit | Attending: Family Medicine | Admitting: Family Medicine

## 2022-12-24 DIAGNOSIS — R945 Abnormal results of liver function studies: Secondary | ICD-10-CM | POA: Diagnosis not present

## 2022-12-24 DIAGNOSIS — R7989 Other specified abnormal findings of blood chemistry: Secondary | ICD-10-CM

## 2022-12-28 ENCOUNTER — Ambulatory Visit (INDEPENDENT_AMBULATORY_CARE_PROVIDER_SITE_OTHER): Payer: Medicare Other | Admitting: Podiatry

## 2022-12-28 ENCOUNTER — Encounter: Payer: Self-pay | Admitting: Podiatry

## 2022-12-28 DIAGNOSIS — B351 Tinea unguium: Secondary | ICD-10-CM | POA: Diagnosis not present

## 2022-12-28 DIAGNOSIS — E119 Type 2 diabetes mellitus without complications: Secondary | ICD-10-CM

## 2022-12-28 DIAGNOSIS — M79674 Pain in right toe(s): Secondary | ICD-10-CM

## 2022-12-28 NOTE — Progress Notes (Signed)
  Subjective:  Patient ID: Jon Huang, male    DOB: 1946/04/19,   MRN: [id number]  Chief Complaint  Patient presents with   Nail Problem    Nail trim     77 y.o. male presents for nail trim and diabetic foot care. Relates he has had diabetes for several years. Hoping to have his nails trimmed. Last A1c was 6.4 . Follows with PCP London Pepper .  Marland Kitchen Denies any other pedal complaints. Denies n/v/f/c.   Past Medical History:  Diagnosis Date   Coronary artery disease April 2010   3.5- x 15-mm XIENCE DES LAD 95>0/ chronic total occlusion of the dCFX, high-grade stenosis in the OM2, and mCFX, PTCA attempted w/ 1.5 mm balloon, minimal success   Diabetes mellitus, type 2 ([place])    History of heart attack    posterior   Hypercholesterolemia     History of hypercholesterolemia   Hypertension    Obesity    OSA (obstructive sleep apnea)     Objective:  Physical Exam: Vascular: DP/PT pulses 2/4 bilateral. CFT <3 seconds. Normal hair growth on digits. No edema.  Skin. No lacerations or abrasions bilateral feet. Nails 1-5 are thickened discolored and elongated with subungual debris.  Musculoskeletal: MMT 5/5 bilateral lower extremities in DF, PF, Inversion and Eversion. Deceased ROM in DF of ankle joint.  Neurological: Sensation intact to light touch.   Assessment:   1. Pain due to onychomycosis of toenail of right foot   2. Type 2 diabetes mellitus without complication, unspecified whether long term insulin use ([place])         Plan:  Patient was evaluated and treated and all questions answered. -Discussed and educated patient on diabetic foot care, especially with  regards to the vascular, neurological and musculoskeletal systems.  -Stressed the importance of good glycemic control and the detriment of not  controlling glucose levels in relation to the foot. -Discussed supportive shoes at all times and checking feet regularly.  -Mechanically debrided all nails 1-5 bilateral using  sterile nail nipper and filed with dremel without incident  -Answered all patient questions -Patient to return  in 3 months for at risk foot care -Patient advised to call the office if any problems or questions arise in the meantime.   Lorenda Peck, DPM

## 2023-02-03 ENCOUNTER — Other Ambulatory Visit: Payer: Self-pay | Admitting: Cardiology

## 2023-02-25 DIAGNOSIS — I10 Essential (primary) hypertension: Secondary | ICD-10-CM | POA: Diagnosis not present

## 2023-02-25 DIAGNOSIS — E1169 Type 2 diabetes mellitus with other specified complication: Secondary | ICD-10-CM | POA: Diagnosis not present

## 2023-02-25 DIAGNOSIS — H6121 Impacted cerumen, right ear: Secondary | ICD-10-CM | POA: Diagnosis not present

## 2023-02-25 DIAGNOSIS — E039 Hypothyroidism, unspecified: Secondary | ICD-10-CM | POA: Diagnosis not present

## 2023-02-25 DIAGNOSIS — H838X3 Other specified diseases of inner ear, bilateral: Secondary | ICD-10-CM | POA: Diagnosis not present

## 2023-02-25 DIAGNOSIS — H903 Sensorineural hearing loss, bilateral: Secondary | ICD-10-CM | POA: Diagnosis not present

## 2023-02-25 DIAGNOSIS — E785 Hyperlipidemia, unspecified: Secondary | ICD-10-CM | POA: Diagnosis not present

## 2023-03-30 ENCOUNTER — Ambulatory Visit (INDEPENDENT_AMBULATORY_CARE_PROVIDER_SITE_OTHER): Payer: Medicare Other | Admitting: Podiatry

## 2023-03-30 ENCOUNTER — Encounter: Payer: Self-pay | Admitting: Podiatry

## 2023-03-30 DIAGNOSIS — M79675 Pain in left toe(s): Secondary | ICD-10-CM | POA: Diagnosis not present

## 2023-03-30 DIAGNOSIS — E119 Type 2 diabetes mellitus without complications: Secondary | ICD-10-CM | POA: Diagnosis not present

## 2023-03-30 DIAGNOSIS — M79674 Pain in right toe(s): Secondary | ICD-10-CM | POA: Diagnosis not present

## 2023-03-30 DIAGNOSIS — B351 Tinea unguium: Secondary | ICD-10-CM

## 2023-03-30 NOTE — Progress Notes (Signed)
  Subjective:  Patient ID: Jon Huang, male    DOB: March 12, 1946,   MRN: [id number]  Chief Complaint  Patient presents with   Nail Problem     Routine foot care    77 y.o. male presents for nail trim and diabetic foot care. Relates he has had diabetes for several years. Hoping to have his nails trimmed. Last A1c was 6.4 . Follows with PCP Farris Has .  Marland Kitchen Denies any other pedal complaints. Denies n/v/f/c.   Past Medical History:  Diagnosis Date   Coronary artery disease April 2010   3.5- x 15-mm XIENCE DES LAD 95>0/ chronic total occlusion of the dCFX, high-grade stenosis in the OM2, and mCFX, PTCA attempted w/ 1.5 mm balloon, minimal success   Diabetes mellitus, type 2 ([facility])    History of heart attack    posterior   Hypercholesterolemia     History of hypercholesterolemia   Hypertension    Obesity    OSA (obstructive sleep apnea)     Objective:  Physical Exam: Vascular: DP/PT pulses 2/4 bilateral. CFT <3 seconds. Normal hair growth on digits. No edema.  Skin. No lacerations or abrasions bilateral feet. Nails 1-5 are thickened discolored and elongated with subungual debris.  Musculoskeletal: MMT 5/5 bilateral lower extremities in DF, PF, Inversion and Eversion. Deceased ROM in DF of ankle joint.  Neurological: Sensation intact to light touch.   Assessment:   1. Pain due to onychomycosis of toenail of right foot   2. Type 2 diabetes mellitus without complication, unspecified whether long term insulin use ([facility])   3. Pain due to onychomycosis of toenail of left foot          Plan:  Patient was evaluated and treated and all questions answered. -Discussed and educated patient on diabetic foot care, especially with  regards to the vascular, neurological and musculoskeletal systems.  -Stressed the importance of good glycemic control and the detriment of not  controlling glucose levels in relation to the foot. -Discussed supportive shoes at all times and checking feet  regularly.  -Mechanically debrided all nails 1-5 bilateral using sterile nail nipper and filed with dremel without incident  -Answered all patient questions -Patient to return  in 3 months for at risk foot care -Patient advised to call the office if any problems or questions arise in the meantime.   Louann Sjogren, DPM

## 2023-04-19 DIAGNOSIS — L820 Inflamed seborrheic keratosis: Secondary | ICD-10-CM | POA: Diagnosis not present

## 2023-04-19 DIAGNOSIS — Z85828 Personal history of other malignant neoplasm of skin: Secondary | ICD-10-CM | POA: Diagnosis not present

## 2023-05-10 DIAGNOSIS — I700 Atherosclerosis of aorta: Secondary | ICD-10-CM | POA: Diagnosis not present

## 2023-05-10 DIAGNOSIS — N400 Enlarged prostate without lower urinary tract symptoms: Secondary | ICD-10-CM | POA: Diagnosis not present

## 2023-05-10 DIAGNOSIS — G4733 Obstructive sleep apnea (adult) (pediatric): Secondary | ICD-10-CM | POA: Diagnosis not present

## 2023-05-10 DIAGNOSIS — Z23 Encounter for immunization: Secondary | ICD-10-CM | POA: Diagnosis not present

## 2023-05-10 DIAGNOSIS — E119 Type 2 diabetes mellitus without complications: Secondary | ICD-10-CM | POA: Diagnosis not present

## 2023-05-10 DIAGNOSIS — M109 Gout, unspecified: Secondary | ICD-10-CM | POA: Diagnosis not present

## 2023-05-10 DIAGNOSIS — Z0000 Encounter for general adult medical examination without abnormal findings: Secondary | ICD-10-CM | POA: Diagnosis not present

## 2023-05-10 DIAGNOSIS — I10 Essential (primary) hypertension: Secondary | ICD-10-CM | POA: Diagnosis not present

## 2023-05-10 DIAGNOSIS — E785 Hyperlipidemia, unspecified: Secondary | ICD-10-CM | POA: Diagnosis not present

## 2023-06-07 DIAGNOSIS — E785 Hyperlipidemia, unspecified: Secondary | ICD-10-CM | POA: Diagnosis not present

## 2023-06-07 DIAGNOSIS — E1169 Type 2 diabetes mellitus with other specified complication: Secondary | ICD-10-CM | POA: Diagnosis not present

## 2023-06-30 ENCOUNTER — Encounter: Payer: Self-pay | Admitting: Podiatry

## 2023-06-30 ENCOUNTER — Ambulatory Visit (INDEPENDENT_AMBULATORY_CARE_PROVIDER_SITE_OTHER): Payer: PPO | Admitting: Podiatry

## 2023-06-30 DIAGNOSIS — B351 Tinea unguium: Secondary | ICD-10-CM

## 2023-06-30 DIAGNOSIS — E119 Type 2 diabetes mellitus without complications: Secondary | ICD-10-CM | POA: Diagnosis not present

## 2023-06-30 DIAGNOSIS — M79674 Pain in right toe(s): Secondary | ICD-10-CM | POA: Diagnosis not present

## 2023-06-30 NOTE — Progress Notes (Signed)
Subjective:  Patient ID: Jon Huang, male    DOB: 20-Feb-1946,   MRN: [id number]  Chief Complaint  Patient presents with   Diabetes    [facility]    77 y.o. male presents for nail trim and diabetic foot care. Relates he has had diabetes for several years. Hoping to have his nails trimmed. Last A1c was 6.4 . Follows with PCP Farris Has .  Marland Kitchen Denies any other pedal complaints. Denies n/v/f/c.   Past Medical History:  Diagnosis Date   Coronary artery disease April 2010   3.5- x 15-mm XIENCE DES LAD 95>0/ chronic total occlusion of the dCFX, high-grade stenosis in the OM2, and mCFX, PTCA attempted w/ 1.5 mm balloon, minimal success   Diabetes mellitus, type 2 ([facility])    History of heart attack    posterior   Hypercholesterolemia     History of hypercholesterolemia   Hypertension    Obesity    OSA (obstructive sleep apnea)     Objective:  Physical Exam: Vascular: DP/PT pulses 2/4 bilateral. CFT <3 seconds. Normal hair growth on digits. No edema.  Skin. No lacerations or abrasions bilateral feet. Nails 1-5 are thickened discolored and elongated with subungual debris.  Musculoskeletal: MMT 5/5 bilateral lower extremities in DF, PF, Inversion and Eversion. Deceased ROM in DF of ankle joint.  Neurological: Sensation intact to light touch.   Assessment:   1. Pain due to onychomycosis of toenail of right foot   2. Type 2 diabetes mellitus without complication, unspecified whether long term insulin use ([facility])           Plan:  Patient was evaluated and treated and all questions answered. -Discussed and educated patient on diabetic foot care, especially with  regards to the vascular, neurological and musculoskeletal systems.  -Stressed the importance of good glycemic control and the detriment of not  controlling glucose levels in relation to the foot. -Discussed supportive shoes at all times and checking feet regularly.  -Mechanically debrided all nails 1-5 bilateral using sterile  nail nipper and filed with dremel without incident  -Answered all patient questions -Patient to return  in 3 months for at risk foot care -Patient advised to call the office if any problems or questions arise in the meantime.   Louann Sjogren, DPM

## 2023-07-08 DIAGNOSIS — H43811 Vitreous degeneration, right eye: Secondary | ICD-10-CM | POA: Diagnosis not present

## 2023-07-13 ENCOUNTER — Telehealth: Payer: Self-pay | Admitting: Cardiology

## 2023-07-13 NOTE — Telephone Encounter (Signed)
Paper Work Dropped Off: Cardiac condition check list  Date: 10.1.24  Location of paper:  MD box  If any questions call patient at [phone]

## 2023-07-13 NOTE — Telephone Encounter (Signed)
Spoke to patient advised Dr.Jordan out of office today.I will call you when form ready for pick up.

## 2023-07-19 NOTE — Telephone Encounter (Signed)
    Pt is calling back to follow up  

## 2023-07-20 NOTE — Telephone Encounter (Signed)
Spoke to patient [facility] form signed by Dr.Jordan.Form left at [facility] office front desk for pick up.

## 2023-07-22 ENCOUNTER — Ambulatory Visit: Payer: PPO | Attending: Cardiology | Admitting: Cardiology

## 2023-07-22 ENCOUNTER — Encounter: Payer: Self-pay | Admitting: Cardiology

## 2023-07-22 VITALS — BP 138/70 | HR 74 | Ht 67.0 in | Wt 240.0 lb

## 2023-07-22 DIAGNOSIS — I10 Essential (primary) hypertension: Secondary | ICD-10-CM | POA: Diagnosis not present

## 2023-07-22 DIAGNOSIS — E785 Hyperlipidemia, unspecified: Secondary | ICD-10-CM

## 2023-07-22 DIAGNOSIS — E119 Type 2 diabetes mellitus without complications: Secondary | ICD-10-CM

## 2023-07-22 DIAGNOSIS — I2510 Atherosclerotic heart disease of native coronary artery without angina pectoris: Secondary | ICD-10-CM

## 2023-07-22 MED ORDER — ROSUVASTATIN CALCIUM 40 MG PO TABS: 40.0000 mg | ORAL_TABLET | Freq: Every day | ORAL | 3 refills | Status: DC

## 2023-07-22 NOTE — Patient Instructions (Signed)
Medication Instructions:  Stop simvastatin. Start crestor( rosuvastatin)  40 mg once daily Continue all medications *If you need a refill on your cardiac medications before your next appointment, please call your pharmacy*   Lab Work: Repeat labs with primary care     Testing/Procedures: none   Follow-Up: At [facility], you and your health needs are our priority.  As part of our continuing mission to provide you with exceptional heart care, we have created designated Provider Care Teams.  These Care Teams include your primary Cardiologist (physician) and Advanced Practice Providers (APPs -  Physician Assistants and Nurse Practitioners) who all work together to provide you with the care you need, when you need it.  We recommend signing up for the patient portal called "MyChart".  Sign up information is provided on this After Visit Summary.  MyChart is used to connect with patients for Virtual Visits (Telemedicine).  Patients are able to view lab/test results, encounter notes, upcoming appointments, etc.  Non-urgent messages can be sent to your provider as well.   To learn more about what you can do with MyChart, go to [email or web address].    Your next appointment:   Follow up in one year  call in June to make an October appointment    Provider:   De. [place]

## 2023-07-22 NOTE — Progress Notes (Signed)
Jon Huang Date of Birth: Jun 02, 1946   History of Present Illness: Jon Huang is seen for followup today. He has a history of CAD. He is s/p posterior MI in 1997 with occlusion of the LCx at that time. He was treated medically. In 2010 he had an abnormal stress test and cardiac cath demonstrated a severe proximal LAD stenosis. The LCx occlusion was collateralized. He had stenting of the LAD with a 3.5 x 15 mm Xience DES. We attempted to open the LCx. Despite crossing with a wire the lesion could not be dilated beyond a 2.0 mm balloon and the procedure was aborted.   His last ischemic evaluation with a stress Myoview study in October 2015 showed an inferior infarct with no ischemia and ejection fraction of 52%.   On follow up today he is doing very well.  He enjoys playing golf and driving buses. Denies any chest pain or SOB. 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 Thinks he will eventually need surgery. Is followed by Dr Ranell Patrick. He tried Ozempic before but cost was high.   Current Outpatient Medications on File Prior to Visit  Medication Sig Dispense Refill   allopurinol (ZYLOPRIM) 100 MG tablet Take by mouth.     amLODipine (NORVASC) 10 MG tablet Take 10 mg by mouth daily.     aspirin EC 81 MG tablet Take 1 tablet (81 mg total) by mouth daily. 90 tablet 3   benazepril (LOTENSIN) 40 MG tablet Take 1 tablet by mouth Daily.     glipiZIDE (GLUCOTROL XL) 2.5 MG 24 hr tablet Take 7.5 mg by mouth daily.     labetalol (NORMODYNE) 200 MG tablet TAKE 1 TABLET TWICE DAILY. 60 tablet 0   levothyroxine (SYNTHROID) 25 MCG tablet Take 25 mcg by mouth every morning.     metFORMIN (GLUCOPHAGE) 500 MG tablet Take 500 mg by mouth 2 (two) times daily with a meal.     NITROSTAT 0.4 MG SL tablet 1 TAB UNDER TONGUE AS NEEDED FOR CHEST PAIN. MAY REPEAT EVERY 5 MIN FOR A TOTAL OF 3 DOSES. 25 tablet 3   simvastatin (ZOCOR) 20 MG tablet Take 1 tablet (20 mg  total) by mouth at bedtime. 90 tablet 1   spironolactone (ALDACTONE) 25 MG tablet Take 12.5 mg by mouth daily.     tamsulosin (FLOMAX) 0.4 MG CAPS capsule TAKE 1 capsule Orally Once a day.     No current facility-administered medications on file prior to visit.    No Known Allergies  Past Medical History:  Diagnosis Date   Coronary artery disease April 2010   3.5- x 15-mm XIENCE DES LAD 95>0/ chronic total occlusion of the dCFX, high-grade stenosis in the OM2, and mCFX, PTCA attempted w/ 1.5 mm balloon, minimal success   Diabetes mellitus, type 2 (HCC)    History of heart attack    posterior   Hypercholesterolemia     History of hypercholesterolemia   Hypertension    Obesity    OSA (obstructive sleep apnea)     Past Surgical History:  Procedure Laterality Date   CARDIAC CATHETERIZATION  01/17/2009   3.5- x 15-mm XIENCE DES LAD 95>0/ CTO dCFX, high-grade stenosis in the OM2, and mCFX, PTCA attempted w/ 1.5 mm balloon, minimal success   KNEE SURGERY      Social History   Tobacco Use  Smoking Status Former   Current packs/day: 0.00   Types: Cigarettes  Quit date: 02/03/1986   Years since quitting: 37.4  Smokeless Tobacco Never    Social History   Substance and Sexual Activity  Alcohol Use No    Family History  Problem Relation Age of Onset   Hypertension Mother    Heart attack Father    Stroke Other        strong family history of    Heart disease Other        strong family history of   Hypertension Other        strong family history of     Review of Systems:  As noted in history of present illness. All other systems were reviewed and are negative.  Physical Exam: BP 138/70 (BP Location: Left Arm, Patient Position: Sitting, Cuff Size: Normal)   Pulse 74   Ht 5\' 7"  (1.702 m)   Wt 240 lb (108.9 kg)   BMI 37.59 kg/m  GENERAL:  Well appearing obese WM in NAD HEENT:  PERRL, EOMI, sclera are clear. Oropharynx is clear. NECK:  No jugular venous distention,  carotid upstroke brisk and symmetric, no bruits, no thyromegaly or adenopathy LUNGS:  Clear to auscultation bilaterally CHEST:  Unremarkable HEART:  RRR,  PMI not displaced or sustained,S1 and S2 within normal limits, no S3, no S4: no clicks, no rubs, no murmurs ABD:  Soft, nontender. BS +, no masses or bruits. No hepatomegaly, no splenomegaly EXT:  2 + pulses throughout, no edema, no cyanosis no clubbing SKIN:  Warm and dry.  No rashes NEURO:  Alert and oriented x 3. Cranial nerves II through XII intact. PSYCH:  Cognitively intact  LABORATORY DATA:  Dated 02/06/16: cholesterol 126, triglycerides 79, HDL 50, LDL 60.  Dated 09/14/16: A1c , creatinine 1.1. A1c 5.9%. Other chemistries normal.  Dated 02/08/17: cholesterol 150, triglycerides 78, HDL 47, LDL 88.  Dated 09/13/17: A1c 6.4%. Creatinine 1.2. Other chemistries OK. Dated 02/09/18: cholesterol 135, triglycerides 81, HDL 46, LDL 73.  Dated 10/10/18: A1c 6.8. creatinine 1.26. other chemistries normal.  Dated 12/09/20: cholesterol 141, triglycerides 144, HDL 43, LDL 73. Dated 03/27/21: normal LFTs Dated 06/27/21: A1c 8.4%, creatinine 1.23, TSH normal Dated 05/01/22: A1c 6.4%. cholesterol 142, triglycerides 103, HDL 44, LDL 80. Creatinine 1.17. otherwise CMET normal. Normal TSH and CBC Labs dated 06/07/23: A1c 8.6%. cholesterol 167, triglycerides 164, HDL 47, LDL 91. CMET OK   EKG Interpretation Date/Time:  Thursday July 22 2023 09:52:10 EDT Ventricular Rate:  74 PR Interval:  210 QRS Duration:  116 QT Interval:  390 QTC Calculation: 432 R Axis:   13  Text Interpretation: Sinus rhythm with 1st degree A-V block Incomplete right bundle branch block When compared with ECG of 19-Jan-2009 04:50, PR interval has increased Incomplete right bundle branch block is now Present Confirmed by [place], Fredrick Dray [phone]) on 07/22/2023 10:09:53 AM    Assessment / Plan: 1.CAD. S/p DES of proximal LAD in 2010. Chronic occlusion of mid LCx. He remains  asymptomatic. Last  Myoview in October 2015 showed fixed inferior infarct without ischemia. EF 52%.  We will continue ASA 81 mg. Continue beta blocker and amlodipine. If surgery needed in the near future would be cleared from my standpoint.   2. HTN under excellent control.  3. DM type 2 on metformin. Last A1c 8.6%. encourage weight loss. Management per primary care. Would reconsider GLP1 agonist vs SGLT2 inhibitor.   4. Obesity. Encourage weight loss.   5. Hyperlipidemia. On Zocor. LDL 91. Goal < 55. Recommend change in simvastatin to  Crestor 40 mg daily. Encourage increased aerobic activity. Will have follow up labs with Dr Kateri Plummer.   6. RBBB- intermittent and asymptomatic.  7. PVCs asymptomatic continue beta blocker.

## 2023-07-30 ENCOUNTER — Other Ambulatory Visit: Payer: Self-pay | Admitting: Cardiology

## 2023-08-10 ENCOUNTER — Other Ambulatory Visit (HOSPITAL_COMMUNITY): Payer: Self-pay

## 2023-08-14 ENCOUNTER — Encounter: Payer: Self-pay | Admitting: Cardiology

## 2023-08-19 ENCOUNTER — Other Ambulatory Visit (HOSPITAL_COMMUNITY): Payer: Self-pay

## 2023-08-23 DIAGNOSIS — E039 Hypothyroidism, unspecified: Secondary | ICD-10-CM | POA: Diagnosis not present

## 2023-09-06 DIAGNOSIS — E1169 Type 2 diabetes mellitus with other specified complication: Secondary | ICD-10-CM | POA: Diagnosis not present

## 2023-09-06 DIAGNOSIS — I10 Essential (primary) hypertension: Secondary | ICD-10-CM | POA: Diagnosis not present

## 2023-09-06 DIAGNOSIS — E785 Hyperlipidemia, unspecified: Secondary | ICD-10-CM | POA: Diagnosis not present

## 2023-09-06 DIAGNOSIS — I2510 Atherosclerotic heart disease of native coronary artery without angina pectoris: Secondary | ICD-10-CM | POA: Diagnosis not present

## 2023-09-06 DIAGNOSIS — E039 Hypothyroidism, unspecified: Secondary | ICD-10-CM | POA: Diagnosis not present

## 2023-09-06 DIAGNOSIS — E119 Type 2 diabetes mellitus without complications: Secondary | ICD-10-CM | POA: Diagnosis not present

## 2023-09-29 ENCOUNTER — Ambulatory Visit (INDEPENDENT_AMBULATORY_CARE_PROVIDER_SITE_OTHER): Payer: PRIVATE HEALTH INSURANCE | Admitting: Podiatry

## 2023-09-29 DIAGNOSIS — Z91199 Patient's noncompliance with other medical treatment and regimen due to unspecified reason: Secondary | ICD-10-CM

## 2023-09-29 NOTE — Progress Notes (Signed)
No show

## 2023-10-20 ENCOUNTER — Ambulatory Visit (INDEPENDENT_AMBULATORY_CARE_PROVIDER_SITE_OTHER): Payer: PPO | Admitting: Podiatry

## 2023-10-20 ENCOUNTER — Encounter: Payer: Self-pay | Admitting: Podiatry

## 2023-10-20 VITALS — Ht 67.0 in | Wt 240.0 lb

## 2023-10-20 DIAGNOSIS — E119 Type 2 diabetes mellitus without complications: Secondary | ICD-10-CM | POA: Diagnosis not present

## 2023-10-20 DIAGNOSIS — M205X9 Other deformities of toe(s) (acquired), unspecified foot: Secondary | ICD-10-CM

## 2023-10-20 DIAGNOSIS — B351 Tinea unguium: Secondary | ICD-10-CM | POA: Diagnosis not present

## 2023-10-20 DIAGNOSIS — M79674 Pain in right toe(s): Secondary | ICD-10-CM | POA: Diagnosis not present

## 2023-10-20 NOTE — Progress Notes (Signed)
 This patient returns to my office for at risk foot care.  This patient requires this care by a professional since this patient will be at risk due to having diabetes.  This patient is unable to cut nails himself since the patient cannot reach his nails.These nails are painful walking and wearing shoes.  This patient presents for at risk foot care today.  General Appearance  Alert, conversant and in no acute stress.  Vascular  Dorsalis pedis and posterior tibial  pulses are palpable  bilaterally.  Capillary return is within normal limits  bilaterally. Temperature is within normal limits  bilaterally.  Neurologic  Senn-Weinstein monofilament wire test diminished  bilaterally. Muscle power within normal limits bilaterally.  Nails Thick disfigured discolored nails with subungual debris  from hallux to fifth toes bilaterally. No evidence of bacterial infection or drainage bilaterally.  Orthopedic  No limitations of motion  feet .  No crepitus or effusions noted.  Hallux limitus 1st MPJ  B/L.  Skin  normotropic skin with no porokeratosis noted bilaterally.  No signs of infections or ulcers noted.     Onychomycosis  Pain in right toes  Pain in left toes  Consent was obtained for treatment procedures.   Mechanical debridement of nails 1-5  bilaterally performed with a nail nipper.  Filed with dremel without incident. Patient qualifies for diabetic shoes due to DPN and hallux limitus  B/L.   Return office visit  10 weeks                     Told patient to return for periodic foot care and evaluation due to potential at risk complications.   Cordella Bold DPM

## 2024-01-04 DIAGNOSIS — E785 Hyperlipidemia, unspecified: Secondary | ICD-10-CM | POA: Diagnosis not present

## 2024-01-04 DIAGNOSIS — E039 Hypothyroidism, unspecified: Secondary | ICD-10-CM | POA: Diagnosis not present

## 2024-01-04 DIAGNOSIS — E1169 Type 2 diabetes mellitus with other specified complication: Secondary | ICD-10-CM | POA: Diagnosis not present

## 2024-01-04 DIAGNOSIS — I10 Essential (primary) hypertension: Secondary | ICD-10-CM | POA: Diagnosis not present

## 2024-01-04 DIAGNOSIS — I2510 Atherosclerotic heart disease of native coronary artery without angina pectoris: Secondary | ICD-10-CM | POA: Diagnosis not present

## 2024-01-05 ENCOUNTER — Ambulatory Visit: Payer: PPO

## 2024-01-05 ENCOUNTER — Encounter: Payer: Self-pay | Admitting: Podiatry

## 2024-01-05 ENCOUNTER — Ambulatory Visit (INDEPENDENT_AMBULATORY_CARE_PROVIDER_SITE_OTHER): Payer: PPO | Admitting: Podiatry

## 2024-01-05 DIAGNOSIS — M79674 Pain in right toe(s): Secondary | ICD-10-CM | POA: Diagnosis not present

## 2024-01-05 DIAGNOSIS — B351 Tinea unguium: Secondary | ICD-10-CM | POA: Diagnosis not present

## 2024-01-05 DIAGNOSIS — E119 Type 2 diabetes mellitus without complications: Secondary | ICD-10-CM

## 2024-01-05 DIAGNOSIS — M205X9 Other deformities of toe(s) (acquired), unspecified foot: Secondary | ICD-10-CM

## 2024-01-05 NOTE — Progress Notes (Signed)
 Patient presents to the office today for diabetic shoe and insole measuring.  Patient was measured with brannock device to determine size and width for 1 pair of extra depth shoes and foam casted for 3 pair of insoles.   Documentation of medical necessity will be sent to patient's treating diabetic doctor to verify and sign.   Patient's diabetic provider: Farris Has MD   Shoes and insoles will be ordered at that time and patient will be notified for an appointment for fitting when they arrive.   Shoe size (per patient): 10 Brannock measurement: 10WD Shoe choice:   428 / [id number]  Shoe size ordered: 10WD  Ppw signed

## 2024-01-05 NOTE — Progress Notes (Signed)
  Subjective:  Patient ID: Jon Huang, male    DOB: [birth year],   MRN: [id number]  Chief Complaint  Patient presents with   Nail Problem    dfc    78 y.o. male presents for nail trim and diabetic foot care. Relates he has had diabetes for several years. Hoping to have his nails trimmed. Last A1c was 6.4 . Follows with PCP Farris Has .  Marland Kitchen Denies any other pedal complaints. Denies n/v/f/c.   Past Medical History:  Diagnosis Date   Coronary artery disease April 2010   3.5- x 15-mm XIENCE DES LAD 95>0/ chronic total occlusion of the dCFX, high-grade stenosis in the OM2, and mCFX, PTCA attempted w/ 1.5 mm balloon, minimal success   Diabetes mellitus, type 2 (HCC)    History of heart attack    posterior   Hypercholesterolemia     History of hypercholesterolemia   Hypertension    Obesity    OSA (obstructive sleep apnea)     Objective:  Physical Exam: Vascular: DP/PT pulses 2/4 bilateral. CFT <3 seconds. Normal hair growth on digits. No edema.  Skin. No lacerations or abrasions bilateral feet. Nails 1-5 are thickened discolored and elongated with subungual debris.  Musculoskeletal: MMT 5/5 bilateral lower extremities in DF, PF, Inversion and Eversion. Deceased ROM in DF of ankle joint.  Neurological: Sensation intact to light touch.   Assessment:   1. Pain due to onychomycosis of toenail of right foot   2. Type 2 diabetes mellitus without complication, unspecified whether long term insulin use (HCC)            Plan:  Patient was evaluated and treated and all questions answered. -Discussed and educated patient on diabetic foot care, especially with  regards to the vascular, neurological and musculoskeletal systems.  -Stressed the importance of good glycemic control and the detriment of not  controlling glucose levels in relation to the foot. -Discussed supportive shoes at all times and checking feet regularly.  -Mechanically debrided all nails 1-5 bilateral using  sterile nail nipper and filed with dremel without incident  -Answered all patient questions -Patient to return  in 3 months for at risk foot care -Patient advised to call the office if any problems or questions arise in the meantime.   Louann Sjogren, DPM

## 2024-01-18 DIAGNOSIS — M11861 Other specified crystal arthropathies, right knee: Secondary | ICD-10-CM | POA: Diagnosis not present

## 2024-01-18 DIAGNOSIS — M25561 Pain in right knee: Secondary | ICD-10-CM | POA: Diagnosis not present

## 2024-01-18 DIAGNOSIS — M48061 Spinal stenosis, lumbar region without neurogenic claudication: Secondary | ICD-10-CM | POA: Diagnosis not present

## 2024-01-18 DIAGNOSIS — M5116 Intervertebral disc disorders with radiculopathy, lumbar region: Secondary | ICD-10-CM | POA: Diagnosis not present

## 2024-01-18 DIAGNOSIS — M25562 Pain in left knee: Secondary | ICD-10-CM | POA: Diagnosis not present

## 2024-01-24 ENCOUNTER — Ambulatory Visit (INDEPENDENT_AMBULATORY_CARE_PROVIDER_SITE_OTHER)

## 2024-01-24 DIAGNOSIS — M2141 Flat foot [pes planus] (acquired), right foot: Secondary | ICD-10-CM | POA: Diagnosis not present

## 2024-01-24 DIAGNOSIS — M2142 Flat foot [pes planus] (acquired), left foot: Secondary | ICD-10-CM | POA: Diagnosis not present

## 2024-01-24 DIAGNOSIS — E119 Type 2 diabetes mellitus without complications: Secondary | ICD-10-CM | POA: Diagnosis not present

## 2024-01-24 DIAGNOSIS — M205X9 Other deformities of toe(s) (acquired), unspecified foot: Secondary | ICD-10-CM | POA: Diagnosis not present

## 2024-01-24 NOTE — Progress Notes (Signed)
 Patient presents today to pick up diabetic shoes and insoles.  Patient was dispensed 1 pair of diabetic shoes and 3 pairs of total contact diabetic insoles. Fit was satisfactory. Instructions for break-in and wear was reviewed and a copy was given to the patient.   Re-appointment for regularly scheduled diabetic foot care visits or if they should experience any trouble with the shoes or insoles.   Britton Cane Cped, CFo, CFm   Prior auth request faxed again as we do not have approval on file

## 2024-02-01 DIAGNOSIS — M5416 Radiculopathy, lumbar region: Secondary | ICD-10-CM | POA: Diagnosis not present

## 2024-02-29 ENCOUNTER — Ambulatory Visit (INDEPENDENT_AMBULATORY_CARE_PROVIDER_SITE_OTHER): Payer: Medicare Other | Admitting: Otolaryngology

## 2024-02-29 ENCOUNTER — Encounter (INDEPENDENT_AMBULATORY_CARE_PROVIDER_SITE_OTHER): Payer: Self-pay | Admitting: Otolaryngology

## 2024-02-29 VITALS — BP 101/62 | HR 85

## 2024-02-29 DIAGNOSIS — H608X9 Other otitis externa, unspecified ear: Secondary | ICD-10-CM | POA: Diagnosis not present

## 2024-02-29 DIAGNOSIS — H608X3 Other otitis externa, bilateral: Secondary | ICD-10-CM | POA: Insufficient documentation

## 2024-02-29 DIAGNOSIS — H6121 Impacted cerumen, right ear: Secondary | ICD-10-CM | POA: Diagnosis not present

## 2024-02-29 DIAGNOSIS — H903 Sensorineural hearing loss, bilateral: Secondary | ICD-10-CM | POA: Diagnosis not present

## 2024-02-29 NOTE — Progress Notes (Signed)
 Patient ID: Jon Huang, male   DOB: 1946-08-08, 78 y.o.   MRN: [id number]  Follow-up: Hearing loss, eczematous otitis externa, right ear papilloma  HPI: The patient is a 78 year old male who returns today for his follow-up evaluation.  The patient has a history of bilateral high-frequency sensorineural hearing loss, chronic eczematous otitis externa, and a right ear papilloma.  He was treated with Elocon cream, with good control of his eczematous otitis externa.  The patient returns today reporting no change in his hearing.  He denies any significant otalgia, otorrhea, or vertigo.  Exam: General: Communicates without difficulty, well nourished, no acute distress. Head: Normocephalic, no evidence injury, no tenderness, facial buttresses intact without stepoff. Face/sinus: No tenderness to palpation and percussion. Facial movement is normal and symmetric. Eyes: PERRL, EOMI. No scleral icterus, conjunctivae clear. Neuro: CN II exam reveals vision grossly intact.  No nystagmus at any point of gaze. Ears: Auricles well formed with a small 2 mm papilloma like lesion on the right auricle.  Right ear cerumen impaction.  Nose: External evaluation reveals normal support and skin without lesions.  Dorsum is intact.  Anterior rhinoscopy reveals pink mucosa over anterior aspect of inferior turbinates and intact septum.  No purulence noted. Oral:  Oral cavity and oropharynx are intact, symmetric, without erythema or edema.  Mucosa is moist without lesions. Neck: Full range of motion without pain.  There is no significant lymphadenopathy.  No masses palpable.  Thyroid  bed within normal limits to palpation.  Parotid glands and submandibular glands equal bilaterally without mass.  Trachea is midline. Neuro:  CN 2-12 grossly intact. Gait normal.   Procedure: Right ear cerumen disimpaction Anesthesia: None Description: Under the operating microscope, the cerumen is carefully removed with a combination of cerumen  currette, alligator forceps, and suction catheters.  After the cerumen is removed, the TMs are noted to be normal.  No mass, erythema, or lesions. The patient tolerated the procedure well.   Assessment: 1.  Right ear cerumen impaction.  After the disimpaction procedure, both tympanic membranes and middle ear spaces are noted to be normal. 2.  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 3.  His chronic eczematous otitis externa is currently under control with the use of Elocon cream. 4.  Subjectively stable bilateral high-frequency sensorineural hearing loss.  Plan: 1.  Otomicroscopy with right ear cerumen disimpaction. 2.  The physical exam findings are reviewed with the patient. 3.  Continue the use of Elocon cream to treat the eczematous otitis externa. 4.  The patient will return for reevaluation in 1 year.

## 2024-03-14 NOTE — Progress Notes (Signed)
 Prior auth recvd from [employer] for shoes and inserts  Prior auth # [id number] valid 01/24/24 - 04/23/24

## 2024-03-22 ENCOUNTER — Ambulatory Visit (INDEPENDENT_AMBULATORY_CARE_PROVIDER_SITE_OTHER): Admitting: Podiatry

## 2024-03-22 ENCOUNTER — Encounter: Payer: Self-pay | Admitting: Podiatry

## 2024-03-22 DIAGNOSIS — M79674 Pain in right toe(s): Secondary | ICD-10-CM

## 2024-03-22 DIAGNOSIS — B351 Tinea unguium: Secondary | ICD-10-CM

## 2024-03-22 DIAGNOSIS — E119 Type 2 diabetes mellitus without complications: Secondary | ICD-10-CM | POA: Diagnosis not present

## 2024-03-22 NOTE — Progress Notes (Signed)
  Subjective:  Patient ID: Jon Huang, male    DOB: 12-15-1945,   MRN: [id number]  Chief Complaint  Patient presents with   Nail Problem    Rm 22 Patient is here for routine foot care and nail trimming. Patient has no additional concerns today.    78 y.o. male presents for nail trim and diabetic foot care. Relates he has had diabetes for several years. Hoping to have his nails trimmed. Last A1c was 6.4 . Follows with PCP Ronna Coho .  Aaron Aas Denies any other pedal complaints. Denies n/v/f/c.   Past Medical History:  Diagnosis Date   Coronary artery disease April 2010   3.5- x 15-mm XIENCE DES LAD 95>0/ chronic total occlusion of the dCFX, high-grade stenosis in the OM2, and mCFX, PTCA attempted w/ 1.5 mm balloon, minimal success   Diabetes mellitus, type 2 (HCC)    History of heart attack    posterior   Hypercholesterolemia     History of hypercholesterolemia   Hypertension    Obesity    OSA (obstructive sleep apnea)     Objective:  Physical Exam: Vascular: DP/PT pulses 2/4 bilateral. CFT <3 seconds. Normal hair growth on digits. No edema.  Skin. No lacerations or abrasions bilateral feet. Nails 1-5 are thickened discolored and elongated with subungual debris.  Musculoskeletal: MMT 5/5 bilateral lower extremities in DF, PF, Inversion and Eversion. Deceased ROM in DF of ankle joint.  Neurological: Sensation intact to light touch.   Assessment:   1. Pain due to onychomycosis of toenail of right foot   2. Type 2 diabetes mellitus without complication, unspecified whether long term insulin use (HCC)            Plan:  Patient was evaluated and treated and all questions answered. -Discussed and educated patient on diabetic foot care, especially with  regards to the vascular, neurological and musculoskeletal systems.  -Stressed the importance of good glycemic control and the detriment of not  controlling glucose levels in relation to the foot. -Discussed supportive shoes  at all times and checking feet regularly.  -Mechanically debrided all nails 1-5 bilateral using sterile nail nipper and filed with dremel without incident  -Answered all patient questions -Patient to return  in 3 months for at risk foot care -Patient advised to call the office if any problems or questions arise in the meantime.   Jennefer Moats, DPM

## 2024-04-28 ENCOUNTER — Other Ambulatory Visit: Payer: Self-pay | Admitting: Cardiology

## 2024-05-02 DIAGNOSIS — R5383 Other fatigue: Secondary | ICD-10-CM | POA: Diagnosis not present

## 2024-05-02 DIAGNOSIS — E1169 Type 2 diabetes mellitus with other specified complication: Secondary | ICD-10-CM | POA: Diagnosis not present

## 2024-05-02 DIAGNOSIS — J988 Other specified respiratory disorders: Secondary | ICD-10-CM | POA: Diagnosis not present

## 2024-05-02 DIAGNOSIS — I10 Essential (primary) hypertension: Secondary | ICD-10-CM | POA: Diagnosis not present

## 2024-05-03 ENCOUNTER — Telehealth: Payer: Self-pay | Admitting: Cardiology

## 2024-05-03 NOTE — Telephone Encounter (Signed)
 Pt c/o of Chest Pain: STAT if active (IN THIS MOMENT) CP, including tightness, pressure, jaw pain, shoulder/upper arm/back pain, SOB, nausea, and vomiting.  1. Are you having CP right now (tightness, pressure, or discomfort)? No   2. Are you experiencing any other symptoms (ex. SOB, nausea, vomiting, sweating)? Fatigue, lightheaded   3. How long have you been experiencing CP? Two weeks   4. Is your CP continuous or coming and going? Coming and going   5. Have you taken Nitroglycerin? No   6. If CP returns before callback, please consider calling 911. ?

## 2024-05-03 NOTE — Telephone Encounter (Signed)
 Spoke with pt, he reports 3 weeks ago [place] country [place] walking [place] lot in high heat, he developed a dull continuous ache in his left shoulder and back. He also developed an occasional sharpe left arm pain with certain movements. He took NTG about 4-5 times and that helped with both pains. He is home now and is pain free but is worried. Follow up scheduled with dr [place] tomorrow morning. ER precautions discussed.

## 2024-05-04 ENCOUNTER — Other Ambulatory Visit: Payer: Self-pay | Admitting: Cardiology

## 2024-05-04 ENCOUNTER — Encounter: Payer: Self-pay | Admitting: Cardiology

## 2024-05-04 ENCOUNTER — Ambulatory Visit: Attending: Cardiology | Admitting: Cardiology

## 2024-05-04 VITALS — BP 116/60 | HR 81 | Ht 67.0 in | Wt 226.0 lb

## 2024-05-04 DIAGNOSIS — E119 Type 2 diabetes mellitus without complications: Secondary | ICD-10-CM | POA: Diagnosis not present

## 2024-05-04 DIAGNOSIS — I25118 Atherosclerotic heart disease of native coronary artery with other forms of angina pectoris: Secondary | ICD-10-CM | POA: Diagnosis not present

## 2024-05-04 DIAGNOSIS — I10 Essential (primary) hypertension: Secondary | ICD-10-CM

## 2024-05-04 DIAGNOSIS — E785 Hyperlipidemia, unspecified: Secondary | ICD-10-CM

## 2024-05-04 DIAGNOSIS — I209 Angina pectoris, unspecified: Secondary | ICD-10-CM

## 2024-05-04 LAB — CBC WITH DIFFERENTIAL/PLATELET

## 2024-05-04 LAB — LIPID PANEL

## 2024-05-04 MED ORDER — MOUNJARO 7.5 MG/0.5ML ~~LOC~~ SOAJ: 7.5000 mg | SUBCUTANEOUS | Status: DC

## 2024-05-04 NOTE — H&P (View-Only) (Signed)
 Jon Huang Date of Birth: 1945-11-06   History of Present Illness: Jon Huang is seen for evaluation of chest pain. He has a history of CAD. He is s/p posterior MI in 1997 with occlusion of the LCx at that time. He was treated medically. In 2010 he had an abnormal stress test and cardiac cath demonstrated a severe proximal LAD stenosis. The LCx occlusion was collateralized. He had stenting of the LAD with a 3.5 x 15 mm Xience DES. We attempted to open the LCx. Despite crossing with a wire the lesion could not be dilated beyond a 2.0 mm balloon and the procedure was aborted.   His last ischemic evaluation with a stress Myoview  study in October 2015 showed an inferior infarct with no ischemia and ejection fraction of 52%.   On follow up today he reports he was on vacation a couple of weeks ago. Did a lot of walking in the heat. Reports during this time multiple episodes of pain in upper back, left shoulder and down left arm. Not as much chest pain but similar to prior angina. Took sl Ntg several times with relief. He is still working 5 hours/daily-mostly computer work. Has lost weight on Mounjaro . Notes he has been excessively fatigued lately.   Current Outpatient Medications on File Prior to Visit  Medication Sig Dispense Refill   allopurinol (ZYLOPRIM) 100 MG tablet Take by mouth.     amLODipine  (NORVASC ) 10 MG tablet Take 5 mg by mouth daily.     aspirin  EC 81 MG tablet Take 1 tablet (81 mg total) by mouth daily. 90 tablet 3   benazepril (LOTENSIN) 40 MG tablet Take 1 tablet by mouth Daily.     glipiZIDE (GLUCOTROL XL) 10 MG 24 hr tablet Take 10 mg by mouth daily with breakfast.     labetalol  (NORMODYNE ) 200 MG tablet TAKE 1 TABLET TWICE DAILY. 60 tablet 0   levothyroxine (SYNTHROID) 25 MCG tablet Take 25 mcg by mouth every morning.     metFORMIN (GLUCOPHAGE) 500 MG tablet Take 500 mg by mouth 2 (two) times daily with a meal.     NITROSTAT 0.4 MG SL tablet 1 TAB UNDER TONGUE AS NEEDED FOR CHEST  PAIN. MAY REPEAT EVERY 5 MIN FOR A TOTAL OF 3 DOSES. 25 tablet 3   rosuvastatin  (CRESTOR ) 40 MG tablet Take 1 tablet (40 mg total) by mouth daily. 90 tablet 3   spironolactone (ALDACTONE) 25 MG tablet Take 12.5 mg by mouth daily.     tamsulosin  (FLOMAX ) 0.4 MG CAPS capsule TAKE 1 capsule Orally Once a day.     No current facility-administered medications on file prior to visit.    No Known Allergies  Past Medical History:  Diagnosis Date   Coronary artery disease April 2010   3.5- x 15-mm XIENCE DES LAD 95>0/ chronic total occlusion of the dCFX, high-grade stenosis in the OM2, and mCFX, PTCA attempted w/ 1.5 mm balloon, minimal success   Diabetes mellitus, type 2 (HCC)    History of heart attack    posterior   Hypercholesterolemia     History of hypercholesterolemia   Hypertension    Obesity    OSA (obstructive sleep apnea)     Past Surgical History:  Procedure Laterality Date   CARDIAC CATHETERIZATION  01/17/2009   3.5- x 15-mm XIENCE DES LAD 95>0/ CTO dCFX, high-grade stenosis in the OM2, and mCFX, PTCA attempted w/ 1.5 mm balloon, minimal success   KNEE SURGERY  Social History   Tobacco Use  Smoking Status Former   Current packs/day: 0.00   Types: Cigarettes   Quit date: 02/03/1986   Years since quitting: 38.2  Smokeless Tobacco Never    Social History   Substance and Sexual Activity  Alcohol Use No    Family History  Problem Relation Age of Onset   Hypertension Mother    Heart attack Father    Stroke Other        strong family history of    Heart disease Other        strong family history of   Hypertension Other        strong family history of     Review of Systems:  As noted in history of present illness. All other systems were reviewed and are negative.  Physical Exam: BP 116/60 (BP Location: Right Arm, Patient Position: Sitting, Cuff Size: Normal)   Pulse 81   Ht 5' 7 (1.702 m)   Wt 226 lb (102.5 kg)   BMI 35.40 kg/m  GENERAL:  Well  appearing obese WM in NAD HEENT:  PERRL, EOMI, sclera are clear. Oropharynx is clear. NECK:  No jugular venous distention, carotid upstroke brisk and symmetric, no bruits, no thyromegaly or adenopathy LUNGS:  Clear to auscultation bilaterally CHEST:  Unremarkable HEART:  RRR,  PMI not displaced or sustained,S1 and S2 within normal limits, no S3, no S4: no clicks, no rubs, no murmurs ABD:  Soft, nontender. BS +, no masses or bruits. No hepatomegaly, no splenomegaly EXT:  2 + pulses throughout, no edema, no cyanosis no clubbing SKIN:  Warm and dry.  No rashes NEURO:  Alert and oriented x 3. Cranial nerves II through XII intact. PSYCH:  Cognitively intact  LABORATORY DATA:  Dated 02/06/16: cholesterol 126, triglycerides 79, HDL 50, LDL 60.  Dated 09/14/16: A1c , creatinine 1.1. A1c 5.9%. Other chemistries normal.  Dated 02/08/17: cholesterol 150, triglycerides 78, HDL 47, LDL 88.  Dated 09/13/17: A1c 6.4%. Creatinine 1.2. Other chemistries OK. Dated 02/09/18: cholesterol 135, triglycerides 81, HDL 46, LDL 73.  Dated 10/10/18: A1c 6.8. creatinine 1.26. other chemistries normal.  Dated 12/09/20: cholesterol 141, triglycerides 144, HDL 43, LDL 73. Dated 03/27/21: normal LFTs Dated 06/27/21: A1c 8.4%, creatinine 1.23, TSH normal Dated 05/01/22: A1c 6.4%. cholesterol 142, triglycerides 103, HDL 44, LDL 80. Creatinine 1.17. otherwise CMET normal. Normal TSH and CBC Labs dated 06/07/23: A1c 8.6%. cholesterol 167, triglycerides 164, HDL 47, LDL 91. CMET OK   EKG Interpretation Date/Time:  Thursday May 04 2024 08:19:04 EDT Ventricular Rate:  81 PR Interval:  194 QRS Duration:  90 QT Interval:  382 QTC Calculation: 443 R Axis:   -3  Text Interpretation: Normal sinus rhythm Low voltage QRS When compared with ECG of 22-Jul-2023 09:52, Incomplete right bundle branch block is no longer Present Confirmed by [place], Poetry Cerro [phone]) on 05/04/2024 8:22:00 AM  EKG Interpretation Date/Time:  Thursday May 04 2024  08:19:04 EDT Ventricular Rate:  81 PR Interval:  194 QRS Duration:  90 QT Interval:  382 QTC Calculation: 443 R Axis:   -3  Text Interpretation: Normal sinus rhythm Low voltage QRS When compared with ECG of 22-Jul-2023 09:52, Incomplete right bundle branch block is no longer Present Confirmed by [place], Kirklin Mcduffee [phone]) on 05/04/2024 8:22:00 AM    Assessment / Plan: 1.CAD. S/p DES of proximal LAD in 2010. Chronic occlusion of mid LCx. He now has symptoms concerning for progressive angina. Relieved with Ntg.  Last  Myoview   in October 2015 showed fixed inferior infarct without ischemia. EF 52%.  We will continue ASA 81 mg. Continue beta blocker and amlodipine . We discussed further ischemic evaluation. I don't think stress testing will be very helpful at this point and recommend going directly to cardiac cath. The procedure and risks were reviewed including but not limited to death, myocardial infarction, stroke, arrythmias, bleeding, transfusion, emergency surgery, dye allergy, or renal dysfunction. The patient voices understanding and is agreeable to proceed.   2. HTN under excellent control.  3. DM type 2 on metformin. On Mounjaro , glipizide and metformin. Will hold Mounjaro  for a week prior to cardiac cath. Hold metformin for 48 hours after.   4. Obesity. He has lost weight on Mounjaro .   5. Hyperlipidemia. Will update labs  6. RBBB- intermittent and asymptomatic.  7. PVCs asymptomatic continue beta blocker.

## 2024-05-04 NOTE — Patient Instructions (Addendum)
 Medication Instructions:  Continue same medications *If you need a refill on your cardiac medications before your next appointment, please call your pharmacy*  Lab Work: Cmet,lipid panel,A1c,cbc today  Testing/Procedures: Cardiac Cath scheduled at [facility] hospital Tuesday 8/5 Arrive at 5:30 am   Follow instructions below   Follow-Up: At [facility], you and your health needs are our priority.  As part of our continuing mission to provide you with exceptional heart care, our providers are all part of one team.  This team includes your primary Cardiologist (physician) and Advanced Practice Providers or APPs (Physician Assistants and Nurse Practitioners) who all work together to provide you with the care you need, when you need it.  Your next appointment:  After Cath    Provider:  Dr.Jordan    [facility] A DEPT OF [place]. [facility] AT [address] A DEPT OF THE [place]. [facility] [address] [place] [address] Dept: [phone] Loc: [phone]  Callie Bunyard [facility]  05/04/2024  You are scheduled for a Cardiac Catheterization on Tuesday, August 5 with Dr. Peter [place].  1. Please arrive at the [facility] (Main Entrance A) at [facility]: [address] [place], [place] [address] at 5:30 AM (This time is 2 hour(s) before your procedure to ensure your preparation).   Free valet parking service is available. You will check in at ADMITTING. The support person will be asked to wait in the waiting room.  It is OK to have someone drop you off and come back when you are ready to be discharged.    Special note: Every effort is made to have your procedure done on time. Please understand that emergencies sometimes delay scheduled procedures.  2. Diet: Do not eat solid foods after midnight.  The patient may have clear liquids until 5am upon the day of the procedure.  3. Labs: You will need to have blood drawn today  4. Medication  instructions in preparation for your procedure:      Hold Mounjaro  7 days before cath   Hold Glipizide morning of cath   Hold Metformin morning of cath and Hold 2 days after cath     Drink 16 oz of water on the way to hospital       On the morning of your procedure, take your Aspirin  81 mg and any morning medicines NOT listed above.  You may use sips of water.  5. Plan to go home the same day, you will only stay overnight if medically necessary. 6. Bring a current list of your medications and current insurance cards. 7. You MUST have a responsible person to drive you home. 8. Someone MUST be with you the first 24 hours after you arrive home or your discharge will be delayed. 9. Please wear clothes that are easy to get on and off and wear slip-on shoes.  Thank you for allowing us  to care for you!   -- [facility]       We recommend signing up for the patient portal called MyChart.  Sign up information is provided on this After Visit Summary.  MyChart is used to connect with patients for Virtual Visits (Telemedicine).  Patients are able to view lab/test results, encounter notes, upcoming appointments, etc.  Non-urgent messages can be sent to your provider as well.   To learn more about what you can do with MyChart, go to [email or web address].

## 2024-05-04 NOTE — Progress Notes (Signed)
 Jon Huang Date of Birth: 1945-11-06   History of Present Illness: Jon Huang is seen for evaluation of chest pain. He has a history of CAD. He is s/p posterior MI in 1997 with occlusion of the LCx at that time. He was treated medically. In 2010 he had an abnormal stress test and cardiac cath demonstrated a severe proximal LAD stenosis. The LCx occlusion was collateralized. He had stenting of the LAD with a 3.5 x 15 mm Xience DES. We attempted to open the LCx. Despite crossing with a wire the lesion could not be dilated beyond a 2.0 mm balloon and the procedure was aborted.   His last ischemic evaluation with a stress Myoview  study in October 2015 showed an inferior infarct with no ischemia and ejection fraction of 52%.   On follow up today he reports he was on vacation a couple of weeks ago. Did a lot of walking in the heat. Reports during this time multiple episodes of pain in upper back, left shoulder and down left arm. Not as much chest pain but similar to prior angina. Took sl Ntg several times with relief. He is still working 5 hours/daily-mostly computer work. Has lost weight on Mounjaro . Notes he has been excessively fatigued lately.   Current Outpatient Medications on File Prior to Visit  Medication Sig Dispense Refill   allopurinol (ZYLOPRIM) 100 MG tablet Take by mouth.     amLODipine  (NORVASC ) 10 MG tablet Take 5 mg by mouth daily.     aspirin  EC 81 MG tablet Take 1 tablet (81 mg total) by mouth daily. 90 tablet 3   benazepril (LOTENSIN) 40 MG tablet Take 1 tablet by mouth Daily.     glipiZIDE (GLUCOTROL XL) 10 MG 24 hr tablet Take 10 mg by mouth daily with breakfast.     labetalol  (NORMODYNE ) 200 MG tablet TAKE 1 TABLET TWICE DAILY. 60 tablet 0   levothyroxine (SYNTHROID) 25 MCG tablet Take 25 mcg by mouth every morning.     metFORMIN (GLUCOPHAGE) 500 MG tablet Take 500 mg by mouth 2 (two) times daily with a meal.     NITROSTAT 0.4 MG SL tablet 1 TAB UNDER TONGUE AS NEEDED FOR CHEST  PAIN. MAY REPEAT EVERY 5 MIN FOR A TOTAL OF 3 DOSES. 25 tablet 3   rosuvastatin  (CRESTOR ) 40 MG tablet Take 1 tablet (40 mg total) by mouth daily. 90 tablet 3   spironolactone (ALDACTONE) 25 MG tablet Take 12.5 mg by mouth daily.     tamsulosin  (FLOMAX ) 0.4 MG CAPS capsule TAKE 1 capsule Orally Once a day.     No current facility-administered medications on file prior to visit.    No Known Allergies  Past Medical History:  Diagnosis Date   Coronary artery disease April 2010   3.5- x 15-mm XIENCE DES LAD 95>0/ chronic total occlusion of the dCFX, high-grade stenosis in the OM2, and mCFX, PTCA attempted w/ 1.5 mm balloon, minimal success   Diabetes mellitus, type 2 (HCC)    History of heart attack    posterior   Hypercholesterolemia     History of hypercholesterolemia   Hypertension    Obesity    OSA (obstructive sleep apnea)     Past Surgical History:  Procedure Laterality Date   CARDIAC CATHETERIZATION  01/17/2009   3.5- x 15-mm XIENCE DES LAD 95>0/ CTO dCFX, high-grade stenosis in the OM2, and mCFX, PTCA attempted w/ 1.5 mm balloon, minimal success   KNEE SURGERY  Social History   Tobacco Use  Smoking Status Former   Current packs/day: 0.00   Types: Cigarettes   Quit date: 02/03/1986   Years since quitting: 38.2  Smokeless Tobacco Never    Social History   Substance and Sexual Activity  Alcohol Use No    Family History  Problem Relation Age of Onset   Hypertension Mother    Heart attack Father    Stroke Other        strong family history of    Heart disease Other        strong family history of   Hypertension Other        strong family history of     Review of Systems:  As noted in history of present illness. All other systems were reviewed and are negative.  Physical Exam: BP 116/60 (BP Location: Right Arm, Patient Position: Sitting, Cuff Size: Normal)   Pulse 81   Ht 5' 7 (1.702 m)   Wt 226 lb (102.5 kg)   BMI 35.40 kg/m  GENERAL:  Well  appearing obese WM in NAD HEENT:  PERRL, EOMI, sclera are clear. Oropharynx is clear. NECK:  No jugular venous distention, carotid upstroke brisk and symmetric, no bruits, no thyromegaly or adenopathy LUNGS:  Clear to auscultation bilaterally CHEST:  Unremarkable HEART:  RRR,  PMI not displaced or sustained,S1 and S2 within normal limits, no S3, no S4: no clicks, no rubs, no murmurs ABD:  Soft, nontender. BS +, no masses or bruits. No hepatomegaly, no splenomegaly EXT:  2 + pulses throughout, no edema, no cyanosis no clubbing SKIN:  Warm and dry.  No rashes NEURO:  Alert and oriented x 3. Cranial nerves II through XII intact. PSYCH:  Cognitively intact  LABORATORY DATA:  Dated 02/06/16: cholesterol 126, triglycerides 79, HDL 50, LDL 60.  Dated 09/14/16: A1c , creatinine 1.1. A1c 5.9%. Other chemistries normal.  Dated 02/08/17: cholesterol 150, triglycerides 78, HDL 47, LDL 88.  Dated 09/13/17: A1c 6.4%. Creatinine 1.2. Other chemistries OK. Dated 02/09/18: cholesterol 135, triglycerides 81, HDL 46, LDL 73.  Dated 10/10/18: A1c 6.8. creatinine 1.26. other chemistries normal.  Dated 12/09/20: cholesterol 141, triglycerides 144, HDL 43, LDL 73. Dated 03/27/21: normal LFTs Dated 06/27/21: A1c 8.4%, creatinine 1.23, TSH normal Dated 05/01/22: A1c 6.4%. cholesterol 142, triglycerides 103, HDL 44, LDL 80. Creatinine 1.17. otherwise CMET normal. Normal TSH and CBC Labs dated 06/07/23: A1c 8.6%. cholesterol 167, triglycerides 164, HDL 47, LDL 91. CMET OK   EKG Interpretation Date/Time:  Thursday May 04 2024 08:19:04 EDT Ventricular Rate:  81 PR Interval:  194 QRS Duration:  90 QT Interval:  382 QTC Calculation: 443 R Axis:   -3  Text Interpretation: Normal sinus rhythm Low voltage QRS When compared with ECG of 22-Jul-2023 09:52, Incomplete right bundle branch block is no longer Present Confirmed by [place], Poetry Cerro [phone]) on 05/04/2024 8:22:00 AM  EKG Interpretation Date/Time:  Thursday May 04 2024  08:19:04 EDT Ventricular Rate:  81 PR Interval:  194 QRS Duration:  90 QT Interval:  382 QTC Calculation: 443 R Axis:   -3  Text Interpretation: Normal sinus rhythm Low voltage QRS When compared with ECG of 22-Jul-2023 09:52, Incomplete right bundle branch block is no longer Present Confirmed by [place], Kirklin Mcduffee [phone]) on 05/04/2024 8:22:00 AM    Assessment / Plan: 1.CAD. S/p DES of proximal LAD in 2010. Chronic occlusion of mid LCx. He now has symptoms concerning for progressive angina. Relieved with Ntg.  Last  Myoview   in October 2015 showed fixed inferior infarct without ischemia. EF 52%.  We will continue ASA 81 mg. Continue beta blocker and amlodipine . We discussed further ischemic evaluation. I don't think stress testing will be very helpful at this point and recommend going directly to cardiac cath. The procedure and risks were reviewed including but not limited to death, myocardial infarction, stroke, arrythmias, bleeding, transfusion, emergency surgery, dye allergy, or renal dysfunction. The patient voices understanding and is agreeable to proceed.   2. HTN under excellent control.  3. DM type 2 on metformin. On Mounjaro , glipizide and metformin. Will hold Mounjaro  for a week prior to cardiac cath. Hold metformin for 48 hours after.   4. Obesity. He has lost weight on Mounjaro .   5. Hyperlipidemia. Will update labs  6. RBBB- intermittent and asymptomatic.  7. PVCs asymptomatic continue beta blocker.

## 2024-05-05 ENCOUNTER — Ambulatory Visit: Payer: Self-pay | Admitting: Cardiology

## 2024-05-05 LAB — COMPREHENSIVE METABOLIC PANEL WITH GFR
ALT: 57 IU/L — ABNORMAL HIGH (ref 0–44)
AST: 33 IU/L (ref 0–40)
Albumin: 4.5 g/dL (ref 3.8–4.8)
Alkaline Phosphatase: 50 IU/L (ref 44–121)
BUN/Creatinine Ratio: 18 (ref 10–24)
BUN: 20 mg/dL (ref 8–27)
Bilirubin Total: 0.6 mg/dL (ref 0.0–1.2)
CO2: 18 mmol/L — ABNORMAL LOW (ref 20–29)
Calcium: 9.3 mg/dL (ref 8.6–10.2)
Chloride: 103 mmol/L (ref 96–106)
Creatinine, Ser: 1.13 mg/dL (ref 0.76–1.27)
Globulin, Total: 2.1 g/dL (ref 1.5–4.5)
Glucose: 126 mg/dL — ABNORMAL HIGH (ref 70–99)
Potassium: 4.9 mmol/L (ref 3.5–5.2)
Sodium: 138 mmol/L (ref 134–144)
Total Protein: 6.6 g/dL (ref 6.0–8.5)
eGFR: 67 mL/min/1.73 (ref >59)

## 2024-05-05 LAB — LIPID PANEL
Cholesterol, Total: 91 mg/dL — AB (ref 100–199)
HDL: 41 mg/dL (ref >39)
LDL CALC COMMENT:: 2.2 ratio (ref 0.0–5.0)
LDL Chol Calc (NIH): 29 mg/dL (ref 0–99)
Triglycerides: 115 mg/dL (ref 0–149)
VLDL Cholesterol Cal: 21 mg/dL (ref 5–40)

## 2024-05-05 LAB — CBC WITH DIFFERENTIAL/PLATELET
Basos: 0
EOS (ABSOLUTE): 0 x10E3/uL (ref 0.0–0.2)
Eos: 1
Hematocrit: 47.6 (ref 37.5–51.0)
Hemoglobin: 15.7 g/dL (ref 13.0–17.7)
Immature Granulocytes: 0
Immature Granulocytes: 0 x10E3/uL (ref 0.0–0.1)
Lymphs: 25
MCH: 29.7 pg (ref 26.6–33.0)
MCHC: 33 g/dL (ref 31.5–35.7)
MCV: 90 fL (ref 79–97)
Monocytes Absolute: 0.1 x10E3/uL (ref 0.0–0.4)
Monocytes Absolute: 0.8 x10E3/uL (ref 0.1–0.9)
Monocytes: 9
Neutrophils Absolute: 2.1 x10E3/uL (ref 0.7–3.1)
Neutrophils Absolute: 5.3 x10E3/uL (ref 1.4–7.0)
Neutrophils: 65
Platelets: 180 x10E3/uL (ref 150–450)
RBC: 5.28 x10E6/uL (ref 4.14–5.80)
RDW: 12.8 (ref 11.6–15.4)
WBC: 8.3 x10E3/uL (ref 3.4–10.8)

## 2024-05-05 LAB — HEMOGLOBIN A1C
Est. average glucose Bld gHb Est-mCnc: 140 mg/dL
Hgb A1c MFr Bld: 6.5 — AB (ref 4.8–5.6)

## 2024-05-15 ENCOUNTER — Telehealth: Payer: Self-pay | Admitting: *Deleted

## 2024-05-15 DIAGNOSIS — E785 Hyperlipidemia, unspecified: Secondary | ICD-10-CM | POA: Diagnosis not present

## 2024-05-15 DIAGNOSIS — I10 Essential (primary) hypertension: Secondary | ICD-10-CM | POA: Diagnosis not present

## 2024-05-15 DIAGNOSIS — N400 Enlarged prostate without lower urinary tract symptoms: Secondary | ICD-10-CM | POA: Diagnosis not present

## 2024-05-15 DIAGNOSIS — E1169 Type 2 diabetes mellitus with other specified complication: Secondary | ICD-10-CM | POA: Diagnosis not present

## 2024-05-15 DIAGNOSIS — M109 Gout, unspecified: Secondary | ICD-10-CM | POA: Diagnosis not present

## 2024-05-15 DIAGNOSIS — I2510 Atherosclerotic heart disease of native coronary artery without angina pectoris: Secondary | ICD-10-CM | POA: Diagnosis not present

## 2024-05-15 DIAGNOSIS — Z0000 Encounter for general adult medical examination without abnormal findings: Secondary | ICD-10-CM | POA: Diagnosis not present

## 2024-05-15 DIAGNOSIS — G4733 Obstructive sleep apnea (adult) (pediatric): Secondary | ICD-10-CM | POA: Diagnosis not present

## 2024-05-15 NOTE — Telephone Encounter (Addendum)
 Cardiac Catheterization scheduled at [facility] for: Tuesday May 16, 2024 7:30 AM Arrival time [facility] Main Entrance A at: 5:30 AM  Diet: -Nothing to eat after midnight prior to procedure.  Hydration: -May drink clear liquids until leaving for hospital. Approved liquids: Water , clear juice, clear tea, black coffee, fruit juices-non-citric and without pulp,Gatorade, plain Jello/popsicles. Drink 16 oz. bottle of water  on the way to the hospital.  Medication instructions: -Hold:  Metformin -day of procedure and 48 hours after  Glipizide/Spironolactone-AM of procedure  Mounjaro -ask patient how he takes this -Other usual morning medications can be taken including aspirin  81 mg.  Plan to go home the same day, you will only stay overnight if medically necessary.  You must have responsible adult to drive you home.  Someone must be with you the first 24 hours after you arrive home.  Reviewed procedure instructions with patient.

## 2024-05-16 ENCOUNTER — Ambulatory Visit (HOSPITAL_COMMUNITY)
Admission: RE | Admit: 2024-05-16 | Discharge: 2024-05-16 | Disposition: A | Discharge status: Home / Self Care | Attending: Cardiology | Admitting: Cardiology

## 2024-05-16 ENCOUNTER — Encounter (HOSPITAL_COMMUNITY)
Admission: RE | Disposition: A | Discharge status: Home / Self Care | Payer: Self-pay | Source: Home / Self Care | Attending: Cardiology

## 2024-05-16 ENCOUNTER — Other Ambulatory Visit: Payer: Self-pay

## 2024-05-16 ENCOUNTER — Encounter (HOSPITAL_COMMUNITY): Payer: Self-pay | Admitting: Cardiology

## 2024-05-16 DIAGNOSIS — Z87891 Personal history of nicotine dependence: Secondary | ICD-10-CM | POA: Diagnosis not present

## 2024-05-16 DIAGNOSIS — Z955 Presence of coronary angioplasty implant and graft: Secondary | ICD-10-CM | POA: Diagnosis not present

## 2024-05-16 DIAGNOSIS — I10 Essential (primary) hypertension: Secondary | ICD-10-CM | POA: Diagnosis not present

## 2024-05-16 DIAGNOSIS — I4510 Unspecified right bundle-branch block: Secondary | ICD-10-CM | POA: Diagnosis not present

## 2024-05-16 DIAGNOSIS — Z7982 Long term (current) use of aspirin: Secondary | ICD-10-CM | POA: Diagnosis not present

## 2024-05-16 DIAGNOSIS — E669 Obesity, unspecified: Secondary | ICD-10-CM | POA: Insufficient documentation

## 2024-05-16 DIAGNOSIS — Z6835 Body mass index (BMI) 35.0-35.9, adult: Secondary | ICD-10-CM | POA: Insufficient documentation

## 2024-05-16 DIAGNOSIS — I25118 Atherosclerotic heart disease of native coronary artery with other forms of angina pectoris: Secondary | ICD-10-CM | POA: Insufficient documentation

## 2024-05-16 DIAGNOSIS — Z7984 Long term (current) use of oral hypoglycemic drugs: Secondary | ICD-10-CM | POA: Diagnosis not present

## 2024-05-16 DIAGNOSIS — Z7985 Long-term (current) use of injectable non-insulin antidiabetic drugs: Secondary | ICD-10-CM | POA: Diagnosis not present

## 2024-05-16 DIAGNOSIS — I2584 Coronary atherosclerosis due to calcified coronary lesion: Secondary | ICD-10-CM | POA: Diagnosis not present

## 2024-05-16 DIAGNOSIS — I493 Ventricular premature depolarization: Secondary | ICD-10-CM | POA: Diagnosis not present

## 2024-05-16 DIAGNOSIS — I209 Angina pectoris, unspecified: Secondary | ICD-10-CM

## 2024-05-16 DIAGNOSIS — I252 Old myocardial infarction: Secondary | ICD-10-CM | POA: Diagnosis not present

## 2024-05-16 DIAGNOSIS — I2510 Atherosclerotic heart disease of native coronary artery without angina pectoris: Secondary | ICD-10-CM | POA: Diagnosis not present

## 2024-05-16 DIAGNOSIS — Z79899 Other long term (current) drug therapy: Secondary | ICD-10-CM | POA: Diagnosis not present

## 2024-05-16 DIAGNOSIS — E119 Type 2 diabetes mellitus without complications: Secondary | ICD-10-CM | POA: Insufficient documentation

## 2024-05-16 DIAGNOSIS — I2582 Chronic total occlusion of coronary artery: Secondary | ICD-10-CM | POA: Diagnosis not present

## 2024-05-16 HISTORY — PX: LEFT HEART CATH AND CORONARY ANGIOGRAPHY: CATH118249

## 2024-05-16 LAB — GLUCOSE, CAPILLARY
Glucose-Capillary: 160 mg/dL — ABNORMAL HIGH (ref 70–99)
Glucose-Capillary: 138 mg/dL — ABNORMAL HIGH (ref 70–99)

## 2024-05-16 SURGERY — LEFT HEART CATH AND CORONARY ANGIOGRAPHY
Anesthesia: LOCAL

## 2024-05-16 MED ORDER — METFORMIN HCL ER 500 MG PO TB24: 1000.0000 mg | ORAL_TABLET | Freq: Every day | ORAL | Status: AC

## 2024-05-16 MED ORDER — IOHEXOL 350 MG/ML SOLN
INTRAVENOUS | Status: DC | PRN
  Administered 2024-05-16: 55 mL

## 2024-05-16 MED ORDER — FREE WATER: 500.0000 mL | Freq: Once | Status: DC

## 2024-05-16 MED ORDER — SODIUM CHLORIDE 0.9 % IV SOLN: 250.0000 mL | INTRAVENOUS | Status: DC | PRN

## 2024-05-16 MED ORDER — LIDOCAINE HCL (PF) 1 % IJ SOLN
INTRAMUSCULAR | Status: DC | PRN
  Administered 2024-05-16: 2 mL

## 2024-05-16 MED ORDER — FENTANYL CITRATE (PF) 100 MCG/2ML IJ SOLN
INTRAMUSCULAR | Status: DC | PRN
  Administered 2024-05-16: 25 ug via INTRAVENOUS

## 2024-05-16 MED ORDER — SODIUM CHLORIDE 0.9 % IV SOLN: INTRAVENOUS | Status: DC

## 2024-05-16 MED ORDER — ASPIRIN 81 MG PO CHEW: 81.0000 mg | CHEWABLE_TABLET | ORAL | Status: DC

## 2024-05-16 MED ORDER — HEPARIN SODIUM (PORCINE) 1000 UNIT/ML IJ SOLN
INTRAMUSCULAR | Status: DC | PRN
  Administered 2024-05-16: 5000 [IU] via INTRAVENOUS

## 2024-05-16 MED ORDER — HEPARIN SODIUM (PORCINE) 1000 UNIT/ML IJ SOLN
INTRAMUSCULAR | Status: AC
  Filled 2024-05-16: qty 10

## 2024-05-16 MED ORDER — RANOLAZINE ER 500 MG PO TB12: 500.0000 mg | ORAL_TABLET | Freq: Two times a day (BID) | ORAL | 11 refills | Status: AC

## 2024-05-16 MED ORDER — FENTANYL CITRATE (PF) 100 MCG/2ML IJ SOLN
INTRAMUSCULAR | Status: AC
  Filled 2024-05-16: qty 2

## 2024-05-16 MED ORDER — ONDANSETRON HCL 4 MG/2ML IJ SOLN: 4.0000 mg | Freq: Four times a day (QID) | INTRAMUSCULAR | Status: DC | PRN

## 2024-05-16 MED ORDER — ACETAMINOPHEN 325 MG PO TABS: 650.0000 mg | ORAL_TABLET | ORAL | Status: DC | PRN

## 2024-05-16 MED ORDER — HEPARIN (PORCINE) IN NACL 1000-0.9 UT/500ML-% IV SOLN
INTRAVENOUS | Status: DC | PRN
  Administered 2024-05-16 (×2): 500 mL

## 2024-05-16 MED ORDER — VERAPAMIL HCL 2.5 MG/ML IV SOLN
INTRAVENOUS | Status: DC | PRN
  Administered 2024-05-16: 10 mL via INTRA_ARTERIAL

## 2024-05-16 MED ORDER — SODIUM CHLORIDE 0.9% FLUSH: 3.0000 mL | INTRAVENOUS | Status: DC | PRN

## 2024-05-16 MED ORDER — SODIUM CHLORIDE 0.9% FLUSH: 3.0000 mL | Freq: Two times a day (BID) | INTRAVENOUS | Status: DC

## 2024-05-16 MED ORDER — MIDAZOLAM HCL 2 MG/2ML IJ SOLN
INTRAMUSCULAR | Status: AC
  Filled 2024-05-16: qty 2

## 2024-05-16 MED ORDER — MIDAZOLAM HCL 2 MG/2ML IJ SOLN
INTRAMUSCULAR | Status: DC | PRN
  Administered 2024-05-16: 1 mg via INTRAVENOUS

## 2024-05-16 MED ORDER — LIDOCAINE HCL (PF) 1 % IJ SOLN
INTRAMUSCULAR | Status: AC
  Filled 2024-05-16: qty 30

## 2024-05-16 MED ORDER — VERAPAMIL HCL 2.5 MG/ML IV SOLN
INTRAVENOUS | Status: AC
  Filled 2024-05-16: qty 2

## 2024-05-16 SURGICAL SUPPLY — 7 items
CATH 5FR JL3.5 JR4 ANG PIG MP (CATHETERS) IMPLANT
DEVICE RAD COMP TR BAND LRG (VASCULAR PRODUCTS) IMPLANT
GLIDESHEATH SLEND SS 6F .021 (SHEATH) IMPLANT
GUIDEWIRE INQWIRE 1.5J.035X260 (WIRE) IMPLANT
PACK CARDIAC CATHETERIZATION (CUSTOM PROCEDURE TRAY) ×1 IMPLANT
SET ATX-X65L (MISCELLANEOUS) IMPLANT
STATION PROTECTION PRESSURIZED (MISCELLANEOUS) IMPLANT

## 2024-05-16 NOTE — Interval H&P Note (Signed)
 History and Physical Interval Note:  05/16/2024 7:37 AM  Jon Huang  has presented today for surgery, with the diagnosis of chest pain.  The various methods of treatment have been discussed with the patient and family. After consideration of risks, benefits and other options for treatment, the patient has consented to  Procedure(s): LEFT HEART CATH AND CORONARY ANGIOGRAPHY (N/A) as a surgical intervention.  The patient's history has been reviewed, patient examined, no change in status, stable for surgery.  I have reviewed the patient's chart and labs.  Questions were answered to the patient's satisfaction.   Cath Lab Visit (complete for each Cath Lab visit)  Clinical Evaluation Leading to the Procedure:   ACS: No.  Non-ACS:    Anginal Classification: CCS III  Anti-ischemic medical therapy: Maximal Therapy (2 or more classes of medications)  Non-Invasive Test Results: No non-invasive testing performed  Prior CABG: No previous CABG        [facility] 05/16/2024 7:37 AM

## 2024-05-16 NOTE — Discharge Instructions (Signed)

## 2024-05-16 NOTE — Progress Notes (Signed)
 PT ambulated in the hallway with staff to the bathroom, was able to void clear yellow urine. Discharge instructions provided to patient and wife at bedside. Denies questions or concerns. Escorted from unit via wheelchair by staff, to [employer] vehicle.

## 2024-06-07 NOTE — Progress Notes (Unsigned)
 Jon Huang Date of Birth: 08-04-1946   History of Present Illness: Jon Huang is seen for evaluation of chest pain. He has a history of CAD. He is s/p posterior MI in 1997 with occlusion of the LCx at that time. He was treated medically. In 2010 he had an abnormal stress test and cardiac cath demonstrated a severe proximal LAD stenosis. The LCx occlusion was collateralized. He had stenting of the LAD with a 3.5 x 15 mm Xience DES. We attempted to open the LCx. Despite crossing with a wire the lesion could not be dilated beyond a 2.0 mm balloon and the procedure was aborted.   His last ischemic evaluation with a stress Myoview  study in October 2015 showed an inferior infarct with no ischemia and ejection fraction of 52%.   When seen last he complained of chest pain and excessive fatigue. This led to cardiac cath showing patency of LAD stent. He has CTO of the LCx with collaterals. Ranexa  was added.   Current Outpatient Medications on File Prior to Visit  Medication Sig Dispense Refill   amLODipine  (NORVASC ) 5 MG tablet Take 5 mg by mouth daily.     aspirin  EC 81 MG tablet Take 1 tablet (81 mg total) by mouth daily. 90 tablet 3   benazepril (LOTENSIN) 40 MG tablet Take 40 mg by mouth Daily.     glipiZIDE (GLUCOTROL XL) 10 MG 24 hr tablet Take 10 mg by mouth daily with breakfast.     labetalol  (NORMODYNE ) 200 MG tablet TAKE 1 TABLET TWICE DAILY. 60 tablet 0   levothyroxine (SYNTHROID) 25 MCG tablet Take 25 mcg by mouth every morning.     metFORMIN  (GLUCOPHAGE -XR) 500 MG 24 hr tablet Take 2 tablets (1,000 mg total) by mouth daily with breakfast.     MOUNJARO  12.5 MG/0.5ML Pen Inject 12.5 mg into the skin.     nitroGLYCERIN (NITROSTAT) 0.4 MG SL tablet 1 TAB UNDER TONGUE AS NEEDED FOR CHEST PAIN. MAY REPEAT EVERY 5 MIN FOR A TOTAL OF 3 DOSES. 25 tablet 3   ranolazine  (RANEXA ) 500 MG 12 hr tablet Take 1 tablet (500 mg total) by mouth 2 (two) times daily. 60 tablet 11   rosuvastatin  (CRESTOR ) 40 MG  tablet Take 1 tablet (40 mg total) by mouth daily. 90 tablet 3   spironolactone (ALDACTONE) 25 MG tablet Take 12.5 mg by mouth daily.     tamsulosin  (FLOMAX ) 0.4 MG CAPS capsule Take 0.4 mg by mouth daily.     No current facility-administered medications on file prior to visit.    No Known Allergies  Past Medical History:  Diagnosis Date   Coronary artery disease April 2010   3.5- x 15-mm XIENCE DES LAD 95>0/ chronic total occlusion of the dCFX, high-grade stenosis in the OM2, and mCFX, PTCA attempted w/ 1.5 mm balloon, minimal success   Diabetes mellitus, type 2 (HCC)    History of heart attack    posterior   Hypercholesterolemia     History of hypercholesterolemia   Hypertension    Obesity    OSA (obstructive sleep apnea)     Past Surgical History:  Procedure Laterality Date   CARDIAC CATHETERIZATION  01/17/2009   3.5- x 15-mm XIENCE DES LAD 95>0/ CTO dCFX, high-grade stenosis in the OM2, and mCFX, PTCA attempted w/ 1.5 mm balloon, minimal success   KNEE SURGERY     LEFT HEART CATH AND CORONARY ANGIOGRAPHY N/A 05/16/2024   Procedure: LEFT HEART CATH AND CORONARY ANGIOGRAPHY;  Surgeon:  [place], Leomar Westberg M, MD;  Location: [facility] INVASIVE CV LAB;  Service: Cardiovascular;  Laterality: N/A;    Social History   Tobacco Use  Smoking Status Former   Current packs/day: 0.00   Types: Cigarettes   Quit date: 02/03/1986   Years since quitting: 38.3  Smokeless Tobacco Never    Social History   Substance and Sexual Activity  Alcohol Use No    Family History  Problem Relation Age of Onset   Hypertension Mother    Heart attack Father    Stroke Other        strong family history of    Heart disease Other        strong family history of   Hypertension Other        strong family history of     Review of Systems:  As noted in history of present illness. All other systems were reviewed and are negative.  Physical Exam: There were no vitals taken for this visit. GENERAL:  Well  appearing obese WM in NAD HEENT:  PERRL, EOMI, sclera are clear. Oropharynx is clear. NECK:  No jugular venous distention, carotid upstroke brisk and symmetric, no bruits, no thyromegaly or adenopathy LUNGS:  Clear to auscultation bilaterally CHEST:  Unremarkable HEART:  RRR,  PMI not displaced or sustained,S1 and S2 within normal limits, no S3, no S4: no clicks, no rubs, no murmurs ABD:  Soft, nontender. BS +, no masses or bruits. No hepatomegaly, no splenomegaly EXT:  2 + pulses throughout, no edema, no cyanosis no clubbing SKIN:  Warm and dry.  No rashes NEURO:  Alert and oriented x 3. Cranial nerves II through XII intact. PSYCH:  Cognitively intact  LABORATORY DATA:  Dated 02/06/16: cholesterol 126, triglycerides 79, HDL 50, LDL 60.  Dated 09/14/16: A1c , creatinine 1.1. A1c 5.9%. Other chemistries normal.  Dated 02/08/17: cholesterol 150, triglycerides 78, HDL 47, LDL 88.  Dated 09/13/17: A1c 6.4%. Creatinine 1.2. Other chemistries OK. Dated 02/09/18: cholesterol 135, triglycerides 81, HDL 46, LDL 73.  Dated 10/10/18: A1c 6.8. creatinine 1.26. other chemistries normal.  Dated 12/09/20: cholesterol 141, triglycerides 144, HDL 43, LDL 73. Dated 03/27/21: normal LFTs Dated 06/27/21: A1c 8.4%, creatinine 1.23, TSH normal Dated 05/01/22: A1c 6.4%. cholesterol 142, triglycerides 103, HDL 44, LDL 80. Creatinine 1.17. otherwise CMET normal. Normal TSH and CBC Labs dated 06/07/23: A1c 8.6%. cholesterol 167, triglycerides 164, HDL 47, LDL 91. CMET OK         Cardiac cath 05/16/24:  LEFT HEART CATH AND CORONARY ANGIOGRAPHY   Conclusion      Prox LAD lesion is 40% stenosed.   Mid Cx to Dist Cx lesion is 100% stenosed.   2nd Mrg lesion is 90% stenosed.   Previously placed Mid LAD stent of unknown type is  widely patent.   The left ventricular systolic function is normal.   LV end diastolic pressure is normal.   The left ventricular ejection fraction is 55-65% by visual estimate.   Single vessel  occlusive CAD with CTO of the mid LCx. The LCx has good right to left collaterals. Continued patency of stent in the LAD.  Normal LV function Normal LVEDP   Plan: continue medical therapy. Will give trial of Ranexa  500 mg bid.    Coronary Diagrams  Diagnostic Dominance: Co-dominant    Assessment / Plan: 1.CAD. S/p DES of proximal LAD in 2010. Chronic occlusion of mid LCx. Stable anatomy on recent cath.   Last  Myoview  in October 2015  showed fixed inferior infarct without ischemia. EF 52%.  We will continue ASA 81 mg. Continue beta blocker and amlodipine . Now on Ranexa .  2. HTN under excellent control.  3. DM type 2 on metformin . On Mounjaro , glipizide and metformin . Will hold Mounjaro  for a week prior to cardiac cath. Hold metformin  for 48 hours after.   4. Obesity. He has lost weight on Mounjaro .   5. Hyperlipidemia. Will update labs  6. RBBB- intermittent and asymptomatic.  7. PVCs asymptomatic continue beta blocker.

## 2024-06-09 ENCOUNTER — Ambulatory Visit: Attending: Cardiology | Admitting: Cardiology

## 2024-06-09 VITALS — BP 110/60 | HR 77 | Ht 67.0 in | Wt 229.4 lb

## 2024-06-09 DIAGNOSIS — I10 Essential (primary) hypertension: Secondary | ICD-10-CM | POA: Diagnosis not present

## 2024-06-09 DIAGNOSIS — I25118 Atherosclerotic heart disease of native coronary artery with other forms of angina pectoris: Secondary | ICD-10-CM

## 2024-06-09 DIAGNOSIS — E785 Hyperlipidemia, unspecified: Secondary | ICD-10-CM

## 2024-06-09 NOTE — Patient Instructions (Signed)
 Medication Instructions:  Continue same medications *If you need a refill on your cardiac medications before your next appointment, please call your pharmacy*  Lab Work: None ordered  Testing/Procedures: None ordered  Follow-Up: At [facility], you and your health needs are our priority.  As part of our continuing mission to provide you with exceptional heart care, our providers are all part of one team.  This team includes your primary Cardiologist (physician) and Advanced Practice Providers or APPs (Physician Assistants and Nurse Practitioners) who all work together to provide you with the care you need, when you need it.  Your next appointment:  6 months   Call in Oct to schedule Feb appointment     Provider:  Dr.Jordan   We recommend signing up for the patient portal called MyChart.  Sign up information is provided on this After Visit Summary.  MyChart is used to connect with patients for Virtual Visits (Telemedicine).  Patients are able to view lab/test results, encounter notes, upcoming appointments, etc.  Non-urgent messages can be sent to your provider as well.   To learn more about what you can do with MyChart, go to [email or web address].

## 2024-06-27 ENCOUNTER — Ambulatory Visit (INDEPENDENT_AMBULATORY_CARE_PROVIDER_SITE_OTHER): Admitting: Podiatry

## 2024-06-27 ENCOUNTER — Encounter: Payer: Self-pay | Admitting: Podiatry

## 2024-06-27 DIAGNOSIS — B351 Tinea unguium: Secondary | ICD-10-CM

## 2024-06-27 DIAGNOSIS — M79674 Pain in right toe(s): Secondary | ICD-10-CM | POA: Diagnosis not present

## 2024-06-27 DIAGNOSIS — E119 Type 2 diabetes mellitus without complications: Secondary | ICD-10-CM

## 2024-06-27 NOTE — Progress Notes (Signed)
  Subjective:  Patient ID: Jon Huang, male    DOB: 1946/07/28,   MRN: [id number]  Chief Complaint  Patient presents with   Diabetes    Trimming my toenails  Saw Dr. [facility]ley [employer] - 05/04/2024; A1c - 6.5    78 y.o. male presents for nail trim and diabetic foot care. Relates he has had diabetes for several years. Hoping to have his nails trimmed. Last A1c was 6.5 . Follows with PCP [facility] [employer] .  [name] Denies any other pedal complaints. Denies n/v/f/c.   Past Medical History:  Diagnosis Date   Coronary artery disease April 2010   3.5- x 15-mm XIENCE DES LAD 95>0/ chronic total occlusion of the dCFX, high-grade stenosis in the OM2, and mCFX, PTCA attempted w/ 1.5 mm balloon, minimal success   Diabetes mellitus, type 2 (HCC)    History of heart attack    posterior   Hypercholesterolemia     History of hypercholesterolemia   Hypertension    Obesity    OSA (obstructive sleep apnea)     Objective:  Physical Exam: Vascular: DP/PT pulses 2/4 bilateral. CFT <3 seconds. Normal hair growth on digits. No edema.  Skin. No lacerations or abrasions bilateral feet. Nails 1-5 are thickened discolored and elongated with subungual debris.  Musculoskeletal: MMT 5/5 bilateral lower extremities in DF, PF, Inversion and Eversion. Deceased ROM in DF of ankle joint.  Neurological: Sensation intact to light touch.   Assessment:   1. Pain due to onychomycosis of toenail of right foot   2. Type 2 diabetes mellitus without complication, unspecified whether long term insulin use (HCC)            Plan:  Patient was evaluated and treated and all questions answered. -Discussed and educated patient on diabetic foot care, especially with  regards to the vascular, neurological and musculoskeletal systems.  -Stressed the importance of good glycemic control and the detriment of not  controlling glucose levels in relation to the foot. -Discussed supportive shoes at all times and checking feet  regularly.  -Mechanically debrided all nails 1-5 bilateral using sterile nail nipper and filed with dremel without incident  -Answered all patient questions -Patient to return  in 3 months for at risk foot care -Patient advised to call the office if any problems or questions arise in the meantime.   [name], DPM

## 2024-07-12 ENCOUNTER — Telehealth: Payer: Self-pay | Admitting: Cardiology

## 2024-07-12 NOTE — Telephone Encounter (Signed)
 Patient dropped off a DMV form for review and sign-off.  I am placing this form in Dr. Gib box later this afternoon.  Please call patient when complete so he can come back to pick up.  Thank you.

## 2024-07-13 NOTE — Telephone Encounter (Signed)
 Left message for pt, dr [place] will not be back in the office until next week. Will be next week before his paperwork is looked at.

## 2024-07-20 NOTE — Telephone Encounter (Signed)
 DMV form completed.Dr.Jordan signed.Form faxed to [facility] at fax # [phone].Copy also mailed to patient's home.

## 2024-08-15 DIAGNOSIS — E039 Hypothyroidism, unspecified: Secondary | ICD-10-CM | POA: Diagnosis not present

## 2024-08-15 DIAGNOSIS — E1169 Type 2 diabetes mellitus with other specified complication: Secondary | ICD-10-CM | POA: Diagnosis not present

## 2024-08-15 DIAGNOSIS — E785 Hyperlipidemia, unspecified: Secondary | ICD-10-CM | POA: Diagnosis not present

## 2024-08-15 DIAGNOSIS — I10 Essential (primary) hypertension: Secondary | ICD-10-CM | POA: Diagnosis not present

## 2024-08-15 DIAGNOSIS — Z20822 Contact with and (suspected) exposure to covid-19: Secondary | ICD-10-CM | POA: Diagnosis not present

## 2024-08-15 DIAGNOSIS — I2510 Atherosclerotic heart disease of native coronary artery without angina pectoris: Secondary | ICD-10-CM | POA: Diagnosis not present

## 2024-09-26 ENCOUNTER — Encounter: Payer: Self-pay | Admitting: Podiatry

## 2024-09-26 ENCOUNTER — Ambulatory Visit: Admitting: Podiatry

## 2024-09-26 DIAGNOSIS — E119 Type 2 diabetes mellitus without complications: Secondary | ICD-10-CM

## 2024-09-26 DIAGNOSIS — M79675 Pain in left toe(s): Secondary | ICD-10-CM | POA: Diagnosis not present

## 2024-09-26 DIAGNOSIS — E1142 Type 2 diabetes mellitus with diabetic polyneuropathy: Secondary | ICD-10-CM

## 2024-09-26 DIAGNOSIS — B351 Tinea unguium: Secondary | ICD-10-CM

## 2024-09-26 DIAGNOSIS — Z0189 Encounter for other specified special examinations: Secondary | ICD-10-CM | POA: Diagnosis not present

## 2024-09-26 DIAGNOSIS — M79674 Pain in right toe(s): Secondary | ICD-10-CM | POA: Diagnosis not present

## 2024-09-26 NOTE — Progress Notes (Signed)
°  Subjective:  Patient ID: Jon Huang, male    DOB: Dec 06, 1945,   MRN: [id number]  Chief Complaint  Patient presents with   Diabetes    We're getting toenails trimmed.  . Saw Dr. Beverley Huang - 08/25/2024; A1c - 6.7    78 y.o. male presents for nail trim and diabetic foot care. Relates he has had diabetes for several years. Hoping to have his nails trimmed. Last A1c was 6.5 . Follows with PCP Jon Huang .  [name] Denies any other pedal complaints. Denies n/v/f/c.   Past Medical History:  Diagnosis Date   Coronary artery disease April 2010   3.5- x 15-mm XIENCE DES LAD 95>0/ chronic total occlusion of the dCFX, high-grade stenosis in the OM2, and mCFX, PTCA attempted w/ 1.5 mm balloon, minimal success   Diabetes mellitus, type 2 (HCC)    History of heart attack    posterior   Hypercholesterolemia     History of hypercholesterolemia   Hypertension    Obesity    OSA (obstructive sleep apnea)     Objective:  Physical Exam: Vascular: DP/PT pulses 2/4 bilateral. CFT <3 seconds. Normal hair growth on digits. No edema.  Skin. No lacerations or abrasions bilateral feet. Nails 1-5 are thickened discolored and elongated with subungual debris.  Musculoskeletal: MMT 5/5 bilateral lower extremities in DF, PF, Inversion and Eversion. Deceased ROM in DF of ankle joint.  Neurological: Sensation intact to light touch.   Assessment:   1. Pain due to onychomycosis of toenail of right foot   2. Pain due to onychomycosis of toenail of left foot   3. Type 2 diabetes mellitus with peripheral neuropathy (HCC)   4. Encounter for diabetic foot exam (HCC)            Plan:  Patient was evaluated and treated and all questions answered. -Discussed and educated patient on diabetic foot care, especially with  regards to the vascular, neurological and musculoskeletal systems.  -Stressed the importance of good glycemic control and the detriment of not  controlling glucose levels in relation to  the foot. -Discussed supportive shoes at all times and checking feet regularly.  -Mechanically debrided all nails 1-5 bilateral using sterile nail nipper and filed with dremel without incident  -Answered all patient questions -Patient to return  in 3 months for at risk foot care -Patient advised to call the office if any problems or questions arise in the meantime.   Jon Huang, DPM

## 2024-12-07 ENCOUNTER — Ambulatory Visit: Admitting: Cardiology

## 2024-12-27 ENCOUNTER — Ambulatory Visit: Admitting: Podiatry
